# Patient Record
Sex: Female | Born: 1960
Health system: Southern US, Community
[De-identification: ages and names within clinical notes are randomized; demographics above are authoritative.]

## PROBLEM LIST (undated history)

## (undated) ENCOUNTER — Ambulatory Visit: Admission: EM | Discharge status: Against Medical Advice | Payer: 59

## (undated) DIAGNOSIS — F32A Depression, unspecified: Secondary | ICD-10-CM

## (undated) DIAGNOSIS — Z87442 Personal history of urinary calculi: Secondary | ICD-10-CM

## (undated) DIAGNOSIS — F329 Major depressive disorder, single episode, unspecified: Secondary | ICD-10-CM

## (undated) DIAGNOSIS — R251 Tremor, unspecified: Secondary | ICD-10-CM

## (undated) DIAGNOSIS — N189 Chronic kidney disease, unspecified: Secondary | ICD-10-CM

## (undated) DIAGNOSIS — R609 Edema, unspecified: Secondary | ICD-10-CM

## (undated) DIAGNOSIS — M549 Dorsalgia, unspecified: Secondary | ICD-10-CM

## (undated) DIAGNOSIS — N809 Endometriosis, unspecified: Secondary | ICD-10-CM

## (undated) DIAGNOSIS — M1990 Unspecified osteoarthritis, unspecified site: Secondary | ICD-10-CM

## (undated) DIAGNOSIS — G8929 Other chronic pain: Secondary | ICD-10-CM

## (undated) DIAGNOSIS — K219 Gastro-esophageal reflux disease without esophagitis: Secondary | ICD-10-CM

## (undated) HISTORY — PX: NASAL SINUS SURGERY: SHX719

## (undated) HISTORY — DX: Other chronic pain: G89.29

## (undated) HISTORY — PX: KNEE ARTHROSCOPY: SUR90

## (undated) HISTORY — DX: Dorsalgia, unspecified: M54.9

## (undated) HISTORY — DX: Endometriosis, unspecified: N80.9

## (undated) HISTORY — PX: KIDNEY SURGERY: SHX687

## (undated) HISTORY — PX: TONSILLECTOMY: SUR1361

---

## 1991-11-16 HISTORY — PX: ABDOMINAL HYSTERECTOMY: SHX81

## 1991-11-16 HISTORY — PX: SALPINGOOPHORECTOMY: SHX82

## 2003-09-30 ENCOUNTER — Other Ambulatory Visit: Payer: Self-pay

## 2004-12-10 ENCOUNTER — Emergency Department: Payer: Self-pay | Admitting: Emergency Medicine

## 2004-12-12 ENCOUNTER — Emergency Department: Payer: Self-pay | Admitting: Unknown Physician Specialty

## 2005-01-19 ENCOUNTER — Ambulatory Visit: Payer: Self-pay | Admitting: Cardiology

## 2005-06-21 ENCOUNTER — Ambulatory Visit: Payer: Self-pay | Admitting: Family Medicine

## 2005-06-25 ENCOUNTER — Ambulatory Visit: Payer: Self-pay | Admitting: Family Medicine

## 2006-05-05 ENCOUNTER — Ambulatory Visit: Payer: Self-pay | Admitting: Family Medicine

## 2006-05-28 ENCOUNTER — Emergency Department: Payer: Self-pay | Admitting: Emergency Medicine

## 2006-05-30 ENCOUNTER — Ambulatory Visit: Payer: Self-pay | Admitting: Emergency Medicine

## 2006-06-21 ENCOUNTER — Emergency Department: Payer: Self-pay | Admitting: Emergency Medicine

## 2006-08-08 ENCOUNTER — Emergency Department: Payer: Self-pay | Admitting: Emergency Medicine

## 2007-07-29 ENCOUNTER — Emergency Department: Payer: Self-pay | Admitting: Emergency Medicine

## 2007-10-02 ENCOUNTER — Ambulatory Visit: Payer: Self-pay | Admitting: Specialist

## 2007-11-16 HISTORY — PX: SALPINGOOPHORECTOMY: SHX82

## 2008-04-17 ENCOUNTER — Ambulatory Visit: Payer: Self-pay | Admitting: Unknown Physician Specialty

## 2008-06-24 ENCOUNTER — Ambulatory Visit: Payer: Self-pay | Admitting: Unknown Physician Specialty

## 2008-07-04 ENCOUNTER — Ambulatory Visit: Payer: Self-pay | Admitting: Unknown Physician Specialty

## 2009-05-26 ENCOUNTER — Ambulatory Visit: Payer: Self-pay | Admitting: Internal Medicine

## 2009-11-19 ENCOUNTER — Ambulatory Visit: Payer: Self-pay | Admitting: Unknown Physician Specialty

## 2010-02-26 ENCOUNTER — Ambulatory Visit: Payer: Self-pay | Admitting: Otolaryngology

## 2010-11-15 HISTORY — PX: BREAST BIOPSY: SHX20

## 2010-11-26 ENCOUNTER — Ambulatory Visit: Payer: Self-pay | Admitting: Unknown Physician Specialty

## 2010-12-02 ENCOUNTER — Ambulatory Visit: Payer: Self-pay | Admitting: Unknown Physician Specialty

## 2010-12-16 ENCOUNTER — Ambulatory Visit: Payer: Self-pay | Admitting: Unknown Physician Specialty

## 2011-12-01 ENCOUNTER — Ambulatory Visit: Payer: Self-pay | Admitting: Unknown Physician Specialty

## 2012-01-21 ENCOUNTER — Ambulatory Visit: Payer: Self-pay | Admitting: Otolaryngology

## 2012-03-22 ENCOUNTER — Ambulatory Visit: Payer: Self-pay | Admitting: Gastroenterology

## 2012-04-27 ENCOUNTER — Emergency Department: Payer: Self-pay | Admitting: Emergency Medicine

## 2012-04-29 ENCOUNTER — Emergency Department: Payer: Self-pay | Admitting: Internal Medicine

## 2012-04-29 LAB — CBC
HCT: 45 % (ref 35.0–47.0)
HGB: 14.5 g/dL (ref 12.0–16.0)
MCH: 28.3 pg (ref 26.0–34.0)
MCHC: 32.2 g/dL (ref 32.0–36.0)
MCV: 88 fL (ref 80–100)
Platelet: 262 10*3/uL (ref 150–440)
RBC: 5.13 10*6/uL (ref 3.80–5.20)
RDW: 13.5 % (ref 11.5–14.5)
WBC: 13.5 10*3/uL — ABNORMAL HIGH (ref 3.6–11.0)

## 2012-04-29 LAB — COMPREHENSIVE METABOLIC PANEL WITH GFR
Albumin: 3.6 g/dL
Alkaline Phosphatase: 103 U/L
Anion Gap: 7
BUN: 16 mg/dL
Bilirubin,Total: 0.4 mg/dL
Calcium, Total: 9 mg/dL
Chloride: 101 mmol/L
Co2: 29 mmol/L
Creatinine: 0.52 mg/dL — ABNORMAL LOW
EGFR (African American): >60
EGFR (Non-African Amer.): >60
Glucose: 112 mg/dL — ABNORMAL HIGH
Osmolality: 276
Potassium: 3.7 mmol/L
SGOT(AST): 14 U/L — ABNORMAL LOW
SGPT (ALT): 21 U/L
Sodium: 137 mmol/L
Total Protein: 7.9 g/dL

## 2012-04-29 LAB — TROPONIN I: Troponin-I: <0.02 ng/mL

## 2012-04-29 LAB — PROTIME-INR
INR: 0.8
Prothrombin Time: 11.2 secs — ABNORMAL LOW (ref 11.5–14.7)

## 2012-06-19 ENCOUNTER — Ambulatory Visit: Payer: Self-pay | Admitting: Orthopedic Surgery

## 2012-06-30 ENCOUNTER — Emergency Department: Payer: Self-pay | Admitting: Emergency Medicine

## 2012-06-30 LAB — COMPREHENSIVE METABOLIC PANEL
Albumin: 3.6 g/dL (ref 3.4–5.0)
Alkaline Phosphatase: 111 U/L (ref 50–136)
BUN: 14 mg/dL (ref 7–18)
Bilirubin,Total: 0.4 mg/dL (ref 0.2–1.0)
Calcium, Total: 8.8 mg/dL (ref 8.5–10.1)
Chloride: 103 mmol/L (ref 98–107)
Co2: 31 mmol/L (ref 21–32)
Creatinine: 0.64 mg/dL (ref 0.60–1.30)
EGFR (African American): >60
EGFR (Non-African Amer.): >60
Glucose: 93 mg/dL (ref 65–99)
Osmolality: 280 (ref 275–301)
SGOT(AST): 17 U/L (ref 15–37)
SGPT (ALT): 17 U/L (ref 12–78)
Sodium: 140 mmol/L (ref 136–145)

## 2012-06-30 LAB — CBC
MCH: 29 pg (ref 26.0–34.0)
Platelet: 305 10*3/uL (ref 150–440)
RBC: 4.83 10*6/uL (ref 3.80–5.20)
RDW: 13.2 % (ref 11.5–14.5)
WBC: 12.6 10*3/uL — ABNORMAL HIGH (ref 3.6–11.0)

## 2012-07-01 LAB — URINALYSIS, COMPLETE
Bacteria: NONE SEEN
Bilirubin,UR: NEGATIVE
Blood: NEGATIVE
Leukocyte Esterase: NEGATIVE
Ph: 5 (ref 4.5–8.0)
RBC,UR: 1 /HPF (ref 0–5)
Squamous Epithelial: 1
WBC UR: <1 /HPF (ref 0–5)

## 2012-07-01 LAB — DRUG SCREEN, URINE
Barbiturates, Ur Screen: NEGATIVE (ref ?–200)
Cocaine Metabolite,Ur ~~LOC~~: NEGATIVE (ref ?–300)
Opiate, Ur Screen: NEGATIVE (ref ?–300)
Phencyclidine (PCP) Ur S: NEGATIVE (ref ?–25)
Tricyclic, Ur Screen: NEGATIVE (ref ?–1000)

## 2013-08-04 ENCOUNTER — Ambulatory Visit: Payer: Self-pay | Admitting: Internal Medicine

## 2013-11-25 ENCOUNTER — Emergency Department: Payer: Self-pay | Admitting: Emergency Medicine

## 2013-11-25 LAB — URINALYSIS, COMPLETE
Bacteria: NONE SEEN
Bilirubin,UR: NEGATIVE
Glucose,UR: NEGATIVE mg/dL (ref 0–75)
Ketone: NEGATIVE
Leukocyte Esterase: NEGATIVE
NITRITE: NEGATIVE
PH: 5 (ref 4.5–8.0)
Protein: NEGATIVE
RBC,UR: 388 /HPF (ref 0–5)
Specific Gravity: 1.016 (ref 1.003–1.030)
WBC UR: 4 /HPF (ref 0–5)

## 2013-11-25 LAB — COMPREHENSIVE METABOLIC PANEL
Albumin: 3.6 g/dL (ref 3.4–5.0)
Alkaline Phosphatase: 106 U/L
Anion Gap: 3 — ABNORMAL LOW (ref 7–16)
BUN: 16 mg/dL (ref 7–18)
Bilirubin,Total: 0.4 mg/dL (ref 0.2–1.0)
CO2: 28 mmol/L (ref 21–32)
CREATININE: 0.76 mg/dL (ref 0.60–1.30)
Calcium, Total: 8.7 mg/dL (ref 8.5–10.1)
Chloride: 108 mmol/L — ABNORMAL HIGH (ref 98–107)
EGFR (African American): >60
GLUCOSE: 90 mg/dL (ref 65–99)
Osmolality: 278 (ref 275–301)
POTASSIUM: 3.9 mmol/L (ref 3.5–5.1)
SGOT(AST): 13 U/L — ABNORMAL LOW (ref 15–37)
SGPT (ALT): 18 U/L (ref 12–78)
Sodium: 139 mmol/L (ref 136–145)
Total Protein: 7.6 g/dL (ref 6.4–8.2)

## 2013-11-25 LAB — CBC WITH DIFFERENTIAL/PLATELET
HCT: 39.5 % (ref 35.0–47.0)
HGB: 13.5 g/dL (ref 12.0–16.0)
MCV: 87 fL (ref 80–100)
Platelet: 237 10*3/uL (ref 150–440)
RBC: 4.55 10*6/uL (ref 3.80–5.20)
WBC: 5.7 10*3/uL (ref 3.6–11.0)

## 2013-11-25 LAB — LIPASE, BLOOD: Lipase: 76 U/L (ref 73–393)

## 2013-11-27 ENCOUNTER — Observation Stay: Payer: Self-pay | Admitting: Urology

## 2013-11-27 LAB — CBC
HCT: 38.6 % (ref 35.0–47.0)
HGB: 12.9 g/dL (ref 12.0–16.0)
MCH: 28.8 pg (ref 26.0–34.0)
MCHC: 33.4 g/dL (ref 32.0–36.0)
MCV: 86 fL (ref 80–100)
PLATELETS: 221 10*3/uL (ref 150–440)
RBC: 4.46 10*6/uL (ref 3.80–5.20)
RDW: 12.8 % (ref 11.5–14.5)
WBC: 6.5 10*3/uL (ref 3.6–11.0)

## 2013-11-27 LAB — URINALYSIS, COMPLETE
BILIRUBIN, UR: NEGATIVE
Bacteria: NONE SEEN
Glucose,UR: NEGATIVE mg/dL (ref 0–75)
KETONE: NEGATIVE
Leukocyte Esterase: NEGATIVE
Nitrite: NEGATIVE
Ph: 5 (ref 4.5–8.0)
Protein: NEGATIVE
RBC,UR: 20 /HPF (ref 0–5)
Specific Gravity: 1.016 (ref 1.003–1.030)
Squamous Epithelial: 1

## 2013-11-27 LAB — COMPREHENSIVE METABOLIC PANEL
ALBUMIN: 3.7 g/dL (ref 3.4–5.0)
ALK PHOS: 95 U/L
Anion Gap: 3 — ABNORMAL LOW (ref 7–16)
BUN: 17 mg/dL (ref 7–18)
Bilirubin,Total: 0.4 mg/dL (ref 0.2–1.0)
CALCIUM: 9.3 mg/dL (ref 8.5–10.1)
CHLORIDE: 105 mmol/L (ref 98–107)
CREATININE: 0.69 mg/dL (ref 0.60–1.30)
Co2: 28 mmol/L (ref 21–32)
EGFR (African American): >60
EGFR (Non-African Amer.): >60
GLUCOSE: 119 mg/dL — AB (ref 65–99)
Osmolality: 275 (ref 275–301)
Potassium: 3.6 mmol/L (ref 3.5–5.1)
SGOT(AST): 20 U/L (ref 15–37)
SGPT (ALT): 16 U/L (ref 12–78)
Sodium: 136 mmol/L (ref 136–145)
TOTAL PROTEIN: 7.7 g/dL (ref 6.4–8.2)

## 2013-11-27 LAB — HEMATOCRIT: HCT: 34.8 % — AB (ref 35.0–47.0)

## 2013-11-29 LAB — URINE CULTURE

## 2013-12-28 ENCOUNTER — Ambulatory Visit: Payer: Self-pay | Admitting: Unknown Physician Specialty

## 2015-03-08 NOTE — H&P (Signed)
Rivas NAME:  Alison Rivas, Alison Rivas MR#:  [id number] DATE OF BIRTH:  Nov 07, 1961  DATE OF ADMISSION:  11/27/2013  CHIEF COMPLAINT: Right flank pain.   HISTORY OF PRESENT ILLNESS: Alison Rivas is a 54 year old white female with sudden onset of right flank pain on January 10, associated with nausea. Pain became severe, prompting Emergency Room visit on January 11. She had a CT scan at that time revealing a distal 5 mm right ureteral stone with mild hydronephrosis. She was prescribed Flomax and Percocet. Alison pain recurred today and did not respond to Percocet, prompting admission. She had some nausea but no vomiting. She denied fever or chills. She has had no prior similar episodes.   PAST MEDICAL HISTORY:   ALLERGIES: Z-PAK.   CHRONIC HOME MEDICATIONS: Lisinopril and estrogen.   PAST SURGICAL HISTORY: 1. Left knee arthroscopy in 2005.  2.  Left oophorectomy in 2009.  3.  Esophageal dilatation in 2013.   SOCIAL HISTORY: She denied tobacco or drug use.   FAMILY HISTORY: Negative for kidney disease.   PAST AND CURRENT MEDICAL CONDITIONS: Hypertension.   REVIEW OF SYSTEMS: Alison Rivas denied chest pain, shortness of breath, diabetes, stroke or kidney disease.   PHYSICAL EXAMINATION: GENERAL: Obese white female in some distress due to pain.  HEENT: Sclerae were clear. Pupils were equally round, reactive to light and accommodation.  NECK: Supple. No palpable cervical adenopathy. No audible carotid bruits.  LUNGS: Clear to auscultation.   CARDIOVASCULAR: Regular rhythm and rate without audible murmurs.  ABDOMEN: Soft abdomen. She had mild right flank tenderness.  GENITOURINARY AND RECTAL: Deferred.  MUSCULOSKELETAL: Nonfocal.  NEUROLOGICAL: Alert and oriented x 3.   LABORATORY, DIAGNOSTIC AND RADIOLOGIC DATA:    CT scan report was reviewed.    BUN of 17, creatinine of 0.69 today.   IMPRESSION: Right ureterolithiasis with renal colic.   PLAN: 1.  IV fluids and PCA pump for pain.  2.   Strain urine.  3.  Most likely stone pass spontaneously without surgical intervention. I discussed stone disease Alison Rivas.     ____________________________ Suszanne ConnersMichael R. Evelene CroonWolff, MD mrw:cc D: 11/27/2013 20:27:59 ET T: 11/27/2013 20:36:40 ET JOB#: [id number]  cc: Suszanne ConnersMichael R. Evelene CroonWolff, MD, <Dictator> Orson ApeMICHAEL R WOLFF MD ELECTRONICALLY SIGNED 11/28/2013 8:20

## 2015-03-08 NOTE — Op Note (Signed)
PATIENT NAME:  Alison Rivas, Alison Rivas MR#:  [id number] DATE OF BIRTH:  1961/04/01  DATE OF PROCEDURE:  11/27/2013  PREOPERATIVE DIAGNOSIS: Right ureterolithiasis.   POSTOPERATIVE DIAGNOSIS: Right ureterolithiasis.   PROCEDURE: 1.  Right ureteroscopic ureterolithotomy with holmium laser lithotripsy.  2.  Right double pigtail stent placement.  3.  Fluoroscopy.   SURGEON: Anola GurneyMichael Kenlee Vogt, M.D.   ANESTHETISTS: Zena AmosWilliam Kephart, M.D and Anola GurneyMichael Jandel Patriarca, MD  ANESTHETIC METHOD: General per Dr. Henrene HawkingKephart and local per Dr. Evelene CroonWolff.   INDICATION:  See the dictated history and physical. After informed consent, the patient requests the above procedure.   OPERATIVE SUMMARY: After adequate general anesthesia had been obtained, the patient was placed into dorsal lithotomy position and the perineum was prepped and draped in the usual fashion. Fluoroscopy confirmed the presence of a 5 mm distal right ureteral stone. At this point, the 5021 French cystoscope was coupled with the camera and then placed into the bladder. The bladder was thoroughly inspected. Both ureteral orifices were identified and had clear efflux. No bladder mucosal lesions were identified. At this point, a 0.035 Glidewire was advanced up the right ureter and curled into the renal pelvis. A 7 mm balloon ureteral dilating catheter was advanced into the distal ureter and the intramural ureter dilated. The balloon catheter was then removed taking care to leave the guidewire in position. The rigid mini ureteroscope was then coupled with the camera and then advanced into the distal right ureter. The stone was encountered. The 300 micron holmium laser fiber was introduced through the scope and the stone was fully disintegrated. The ureteroscope was then removed. The cystoscope was back loaded over the guidewire. A 6 x 24 cm double pigtail stent was advanced over the guidewire and positioned in the ureter under fluoroscopic guidance. The guidewire was removed, taking  care to leave the stent in position. The suture was left attached to the distal stent. At this point, the bladder was drained and the cystoscope was removed. 10 mL of viscous Xylocaine was instilled within the urethra and the bladder. B and O suppository was placed. The procedure was then terminated and the patient was transferred to the recovery room in stable condition.    ____________________________ Suszanne ConnersMichael R. Evelene CroonWolff, MD mrw:dp D: 11/28/2013 13:27:42 ET T: 11/28/2013 14:07:27 ET JOB#: [id number]  cc: Suszanne ConnersMichael R. Evelene CroonWolff, MD, <Dictator> Orson ApeMICHAEL R Lacoya Wilbanks MD ELECTRONICALLY SIGNED 12/06/2013 8:53

## 2015-07-22 ENCOUNTER — Encounter: Payer: Self-pay | Admitting: *Deleted

## 2015-07-24 ENCOUNTER — Ambulatory Visit: Payer: 59 | Admitting: Anesthesiology

## 2015-07-24 ENCOUNTER — Encounter
Admission: RE | Disposition: A | Discharge status: Home / Self Care | Payer: Self-pay | Source: Ambulatory Visit | Attending: Ophthalmology

## 2015-07-24 ENCOUNTER — Encounter: Payer: Self-pay | Admitting: *Deleted

## 2015-07-24 ENCOUNTER — Ambulatory Visit
Admission: RE | Admit: 2015-07-24 | Discharge: 2015-07-24 | Disposition: A | Discharge status: Home / Self Care | Payer: 59 | Source: Ambulatory Visit | Attending: Ophthalmology | Admitting: Ophthalmology

## 2015-07-24 DIAGNOSIS — F329 Major depressive disorder, single episode, unspecified: Secondary | ICD-10-CM | POA: Insufficient documentation

## 2015-07-24 DIAGNOSIS — K219 Gastro-esophageal reflux disease without esophagitis: Secondary | ICD-10-CM | POA: Diagnosis not present

## 2015-07-24 DIAGNOSIS — H2512 Age-related nuclear cataract, left eye: Secondary | ICD-10-CM | POA: Insufficient documentation

## 2015-07-24 DIAGNOSIS — I10 Essential (primary) hypertension: Secondary | ICD-10-CM | POA: Insufficient documentation

## 2015-07-24 DIAGNOSIS — M1990 Unspecified osteoarthritis, unspecified site: Secondary | ICD-10-CM | POA: Insufficient documentation

## 2015-07-24 DIAGNOSIS — Z881 Allergy status to other antibiotic agents status: Secondary | ICD-10-CM | POA: Diagnosis not present

## 2015-07-24 HISTORY — DX: Major depressive disorder, single episode, unspecified: F32.9

## 2015-07-24 HISTORY — DX: Unspecified osteoarthritis, unspecified site: M19.90

## 2015-07-24 HISTORY — DX: Depression, unspecified: F32.A

## 2015-07-24 HISTORY — DX: Chronic kidney disease, unspecified: N18.9

## 2015-07-24 HISTORY — DX: Gastro-esophageal reflux disease without esophagitis: K21.9

## 2015-07-24 HISTORY — DX: Tremor, unspecified: R25.1

## 2015-07-24 HISTORY — DX: Edema, unspecified: R60.9

## 2015-07-24 HISTORY — PX: CATARACT EXTRACTION W/PHACO: SHX586

## 2015-07-24 SURGERY — PHACOEMULSIFICATION, CATARACT, WITH IOL INSERTION
Anesthesia: Monitor Anesthesia Care | Site: Eye | Laterality: Left | Wound class: Clean

## 2015-07-24 MED ORDER — NA HYALUR & NA CHOND-NA HYALUR 0.4-0.35 ML IO KIT
PACK | INTRAOCULAR | Status: DC | PRN
  Administered 2015-07-24: .75 mL via INTRAOCULAR

## 2015-07-24 MED ORDER — PHENYLEPHRINE HCL 10 % OP SOLN
1.0000 [drp] | OPHTHALMIC | Status: AC | PRN
  Administered 2015-07-24 (×4): 1 [drp] via OPHTHALMIC

## 2015-07-24 MED ORDER — MOXIFLOXACIN HCL 0.5 % OP SOLN
1.0000 [drp] | OPHTHALMIC | Status: AC | PRN
  Administered 2015-07-24 (×3): 1 [drp] via OPHTHALMIC

## 2015-07-24 MED ORDER — MOXIFLOXACIN HCL 0.5 % OP SOLN
OPHTHALMIC | Status: AC
  Administered 2015-07-24: 1 [drp] via OPHTHALMIC
  Filled 2015-07-24: qty 3

## 2015-07-24 MED ORDER — CEFUROXIME OPHTHALMIC INJECTION 1 MG/0.1 ML
INJECTION | OPHTHALMIC | Status: DC | PRN
  Administered 2015-07-24: 0.1 mL via INTRACAMERAL

## 2015-07-24 MED ORDER — TETRACAINE HCL 0.5 % OP SOLN
OPHTHALMIC | Status: AC
  Administered 2015-07-24: 1 [drp] via OPHTHALMIC
  Filled 2015-07-24: qty 2

## 2015-07-24 MED ORDER — PHENYLEPHRINE HCL 10 % OP SOLN
OPHTHALMIC | Status: AC
  Administered 2015-07-24: 1 [drp] via OPHTHALMIC
  Filled 2015-07-24: qty 5

## 2015-07-24 MED ORDER — NA HYALUR & NA CHOND-NA HYALUR 0.55-0.5 ML IO KIT
PACK | INTRAOCULAR | Status: AC
  Filled 2015-07-24: qty 1.05

## 2015-07-24 MED ORDER — BSS IO SOLN
INTRAOCULAR | Status: DC | PRN
  Administered 2015-07-24: 200 mL via INTRAOCULAR

## 2015-07-24 MED ORDER — EPINEPHRINE HCL 1 MG/ML IJ SOLN
INTRAMUSCULAR | Status: AC
  Filled 2015-07-24: qty 1

## 2015-07-24 MED ORDER — LIDOCAINE HCL (PF) 4 % IJ SOLN
INTRAOCULAR | Status: DC | PRN
  Administered 2015-07-24: 4 mL via OPHTHALMIC

## 2015-07-24 MED ORDER — TETRACAINE HCL 0.5 % OP SOLN
1.0000 [drp] | OPHTHALMIC | Status: DC | PRN
  Administered 2015-07-24: 1 [drp] via OPHTHALMIC

## 2015-07-24 MED ORDER — SODIUM CHLORIDE 0.9 % IV SOLN
INTRAVENOUS | Status: DC
  Administered 2015-07-24: 08:00:00 via INTRAVENOUS

## 2015-07-24 MED ORDER — MIDAZOLAM HCL 2 MG/2ML IJ SOLN
INTRAMUSCULAR | Status: DC | PRN
  Administered 2015-07-24: 2 mg via INTRAVENOUS
  Administered 2015-07-24: 1 mg via INTRAVENOUS

## 2015-07-24 MED ORDER — MOXIFLOXACIN HCL 0.5 % OP SOLN
OPHTHALMIC | Status: DC | PRN
  Administered 2015-07-24: 1 [drp] via OPHTHALMIC

## 2015-07-24 MED ORDER — LIDOCAINE HCL (PF) 4 % IJ SOLN
INTRAMUSCULAR | Status: AC
Start: 2015-07-24 — End: 2015-07-24
  Filled 2015-07-24: qty 5

## 2015-07-24 MED ORDER — CYCLOPENTOLATE HCL 2 % OP SOLN
1.0000 [drp] | OPHTHALMIC | Status: AC | PRN
  Administered 2015-07-24 (×4): 1 [drp] via OPHTHALMIC

## 2015-07-24 MED ORDER — CYCLOPENTOLATE HCL 2 % OP SOLN
OPHTHALMIC | Status: AC
  Administered 2015-07-24: 1 [drp] via OPHTHALMIC
  Filled 2015-07-24: qty 2

## 2015-07-24 MED ORDER — CEFUROXIME OPHTHALMIC INJECTION 1 MG/0.1 ML
INJECTION | OPHTHALMIC | Status: AC
  Filled 2015-07-24: qty 0.1

## 2015-07-24 SURGICAL SUPPLY — 22 items
CANNULA ANT/CHMB 27GA (MISCELLANEOUS) ×2 IMPLANT
CUP MEDICINE 2OZ PLAST GRAD ST (MISCELLANEOUS) ×2 IMPLANT
GLOVE BIO SURGEON STRL SZ7 (GLOVE) ×2 IMPLANT
GLOVE SURG LX 6.5 MICRO (GLOVE) ×2
GLOVE SURG LX STRL 6.5 MICRO (GLOVE) ×2 IMPLANT
GOWN STRL REUS W/ TWL LRG LVL3 (GOWN DISPOSABLE) ×2 IMPLANT
GOWN STRL REUS W/TWL LRG LVL3 (GOWN DISPOSABLE) ×2
LENS IOL ACRSF IQ PC 21.5 (Intraocular Lens) ×1 IMPLANT
LENS IOL ACRYSOF IQ POST 21.5 (Intraocular Lens) ×2 IMPLANT
NEEDLE FILTER BLUNT 18X 1/2SAF (NEEDLE) ×1
NEEDLE FILTER BLUNT 18X1 1/2 (NEEDLE) ×1 IMPLANT
PACK CATARACT (MISCELLANEOUS) ×2 IMPLANT
PACK CATARACT BRASINGTON LX (MISCELLANEOUS) ×2 IMPLANT
PACK EYE AFTER SURG (MISCELLANEOUS) ×2 IMPLANT
SOL BSS BAG (MISCELLANEOUS) ×2
SOL PREP PVP 2OZ (MISCELLANEOUS) ×2
SOLUTION BSS BAG (MISCELLANEOUS) ×1 IMPLANT
SOLUTION PREP PVP 2OZ (MISCELLANEOUS) ×1 IMPLANT
SYR 3ML LL SCALE MARK (SYRINGE) ×4 IMPLANT
SYR TB 1ML 27GX1/2 LL (SYRINGE) ×2 IMPLANT
WATER STERILE IRR 1000ML POUR (IV SOLUTION) ×2 IMPLANT
WIPE NON LINTING 3.25X3.25 (MISCELLANEOUS) ×2 IMPLANT

## 2015-07-24 NOTE — Anesthesia Postprocedure Evaluation (Signed)
  Anesthesia Post-op Note  Patient: Alison Rivas  Procedure(s) Performed: Procedure(s) with comments: CATARACT EXTRACTION PHACO AND INTRAOCULAR LENS PLACEMENT (IOC) (Left) - [place]: 00:29.0 AP%: 12.7 CDE: 3.68 Lot # [id number]  Anesthesia type:MAC  Patient location: PACU  Post pain: Pain level controlled  Post assessment: Post-op Vital signs reviewed, Patient's Cardiovascular Status Stable, Respiratory Function Stable, Patent Airway and No signs of Nausea or vomiting  Post vital signs: Reviewed and stable  Last Vitals:  Filed Vitals:   07/24/15 0930  BP: 145/77  Pulse: 66  Temp: 36.1 C  Resp: 16    Level of consciousness: awake, alert  and patient cooperative  Complications: No apparent anesthesia complications

## 2015-07-24 NOTE — Op Note (Signed)
07/24/2015  PRE-OP DIAGNOSIS: Cataract (ICD-10 H25.12) Nuclear sclerotic cataract, LEFT EYE  Post operative diagnosis: Cataract (ICD-10 H25.12) Nuclear sclerotic cataract, LEFT EYE  Procedure: Phacoemulsification with introcular lens [id number](45409)   SURGEON: Surgeon(s) and Role:    * Lia Hopping, MD - Primary  ANESTHESIA: Topical   ESTIMATED BLOOD LOSS: MINIMAL  COMPLICATIONS: None  OPERATIVE DESCRIPTION:  Therapeutic options were discussed with the patient preoperatively, including a discussion of risks and benefits of surgery.  Informed consent was obtained. A dilated fundus exam was performed within 6 months.   The patient was premedicated and brought to the operating room and placed on the operating table in the supine position.  Topical tetracaine was instilled.  After adequate anesthesia, the patient was prepped and draped in the usual fashion.  A wire lid speculum was inserted and the microscope was positioned.  A sideport was used to create a paracentesis site and a mixture of preservative-free lidocaine, BSS, and epinephrine was was instilled into the anterior chamber, followed by viscoelastic.  A clear corneal incision was created using a keratome blade.  Capsulorrhexis was then performed.  In situ phacoemulsification was performed.  Cortical material was removed with the irrigation-aspiration unit.  Viscoelastic was instilled to open the capsular bag.  A posterior chamber intraocular lens, model 21.5 diopters, was inserted and positioned.  Irrigation-aspiration was used to remove all viscoelastic. Intracameral cefuroxime was injected into the eye. Wounds were checked for leakage and confirmed to be secure.  Lid speculum was removed and a shield was placed over the eye.  Patient was returned to the recovery room in stable condition.  IMPLANTS:   Implant Name Type Inv. Item Serial No. Manufacturer Lot No. LRB No. Used  IMPLANT LENS - [id number] Intraocular Lens IMPLANT LENS  [id number] ALCON   Left 1     Postoperative care and discharge medication counseling was discussed with the patient or the parents prior to discharge

## 2015-07-24 NOTE — Anesthesia Preprocedure Evaluation (Signed)
Anesthesia Evaluation  Patient identified by MRN, date of birth, ID band Patient awake    Reviewed: Allergy & Precautions, NPO status , Patient's Chart, lab work & pertinent test results, reviewed documented beta blocker date and time   Airway Mallampati: II  TM Distance: >3 FB     Dental  (+) Chipped   Pulmonary           Cardiovascular hypertension, Pt. on medications      Neuro/Psych PSYCHIATRIC DISORDERS Depression    GI/Hepatic GERD  ,  Endo/Other    Renal/GU Renal InsufficiencyRenal disease     Musculoskeletal  (+) Arthritis ,   Abdominal   Peds  Hematology   Anesthesia Other Findings   Reproductive/Obstetrics                             Anesthesia Physical Anesthesia Plan  ASA: II  Anesthesia Plan: MAC   Post-op Pain Management:    Induction:   Airway Management Planned:   Additional Equipment:   Intra-op Plan:   Post-operative Plan:   Informed Consent: I have reviewed the patients History and Physical, chart, labs and discussed the procedure including the risks, benefits and alternatives for the proposed anesthesia with the patient or authorized representative who has indicated his/her understanding and acceptance.     Plan Discussed with: CRNA  Anesthesia Plan Comments:         Anesthesia Quick Evaluation

## 2015-07-24 NOTE — H&P (Signed)
The history and physical was faxed to the hospital. The history and physical was reviewed by me and no changes have occurred.   

## 2015-07-24 NOTE — Transfer of Care (Signed)
Immediate Anesthesia Transfer of Care Note  Patient: Alison Rivas  Procedure(s) Performed: Procedure(s) with comments: CATARACT EXTRACTION PHACO AND INTRAOCULAR LENS PLACEMENT (IOC) (Left) - [place]: 00:29.0 AP%: 12.7 CDE: 3.68 Lot # [id number]  Patient Location: PACU  Anesthesia Type:MAC  Level of Consciousness: awake, alert  and oriented  Airway & Oxygen Therapy: Patient Spontanous Breathing  Post-op Assessment: Report given to RN and Post -op Vital signs reviewed and stable  Post vital signs: Reviewed and stable  Last Vitals:  Filed Vitals:   07/24/15 0930  BP: 145/77  Pulse: 66  Temp: 36.1 C  Resp: 16    Complications: No apparent anesthesia complications

## 2015-07-24 NOTE — Discharge Instructions (Signed)
Post Operative Instructions  Drop Schedule: Place 1 drop of Durezol in operative eye twice daily Place 1 drop of Ilevro in operative eye once daily Place 1 drop of Vigamox in operative eye four times daily  USE ONLY ONE DROP AT A TIME, WAIT 5 MINUTES BETWEEN DROPS  Eye Protection:  - Do not  rub or squeeze your eyes for one full week. - no strenuous activity or lifting more than 20 pounds during the first week - no hot tubs/saunas/swimming pools for two weeks after surgery - do not get water into your eye (washing your face is permitted but avoid water directly into the eye) - Ensure that you wear the eye shield every night for the first week after surgery   When to call:  It is unusual to have pain following cataract surgery. Pain that is not relieved with Tylenol or Ibuprofen is a reason to call immediately. Vision gradually clears during the first days after surgery; however vision should not get worse or darken. Please call with any unusual symptoms. lights, new floaters, or a curtain over the vision should prompt a phone call.      Eye Surgery Discharge Instructions  Expect mild scratchy sensation or mild soreness. DO NOT RUB YOUR EYE!  The day of surgery:  Minimal physical activity, but bed rest is not required  No reading, computer work, or close hand work  No bending, lifting, or straining.  May watch TV  For 24 hours:  No driving, legal decisions, or alcoholic beverages  Safety precautions  Eat anything you prefer: It is better to start with liquids, then soup then solid foods.  _____ Eye patch should be worn until postoperative exam tomorrow.  ____ Solar shield eyeglasses should be worn for comfort in the sunlight/patch while sleeping  Resume all regular medications including aspirin or Coumadin if these were discontinued prior to surgery. You may shower, bathe, shave, or wash your hair. Tylenol may be taken for mild discomfort.  Call your doctor if  you experience significant pain, nausea, or vomiting, fever > 101 or other signs of infection. [phone] or [phone] Specific instructions:  Follow-up Information    Follow up with Lia Hopping, MD.   Specialty:  Ophthalmology   Why:  Friday Sept. 07/2015 1015   Contact information:   [address] [place] [address] [phone]

## 2016-08-19 ENCOUNTER — Ambulatory Visit
Admission: EM | Admit: 2016-08-19 | Discharge: 2016-08-19 | Disposition: A | Discharge status: Home / Self Care | Payer: 59 | Attending: Family Medicine | Admitting: Family Medicine

## 2016-08-19 DIAGNOSIS — N390 Urinary tract infection, site not specified: Secondary | ICD-10-CM

## 2016-08-19 LAB — URINALYSIS COMPLETE WITH MICROSCOPIC (ARMC ONLY)
Bilirubin Urine: NEGATIVE
GLUCOSE, UA: NEGATIVE mg/dL
HGB URINE DIPSTICK: NEGATIVE
Ketones, ur: NEGATIVE mg/dL
LEUKOCYTES UA: NEGATIVE
Nitrite: NEGATIVE
Protein, ur: 30 mg/dL — AB
Specific Gravity, Urine: 1.02 (ref 1.005–1.030)
pH: 7 (ref 5.0–8.0)

## 2016-08-19 MED ORDER — SULFAMETHOXAZOLE-TRIMETHOPRIM 800-160 MG PO TABS: 1.0000 | ORAL_TABLET | Freq: Two times a day (BID) | ORAL | 0 refills | Status: DC

## 2016-08-19 MED ORDER — PHENAZOPYRIDINE HCL 200 MG PO TABS
200.0000 mg | ORAL_TABLET | Freq: Three times a day (TID) | ORAL | 0 refills | Status: DC
Start: 2016-08-19 — End: 2016-10-24

## 2016-08-19 NOTE — ED Triage Notes (Signed)
Patient reports that she is having urinary urgency and burning that started suddenly this morning. Patient also reports that she is going frequently but little output.

## 2016-08-19 NOTE — ED Provider Notes (Signed)
CSN: [id number]     Arrival date & time 08/19/16  1452 History   None    Chief Complaint  Patient presents with  . Urinary Frequency   (Consider location/radiation/quality/duration/timing/severity/associated sxs/prior Treatment) HPI  This a 55 year old female presents with symptoms of a urinary tract infection. She states that she's been having urinary urgency and burning along with frequency and in satiety. This began this morning. His had a previous urinary tract infections she states this is very similar. Has no vaginal discharge. He denies any fever or chills. She has back pain from previous disc problems but has not had any pain out of the usual for her.       Past Medical History:  Diagnosis Date  . Arthritis   . Cancer (HCC)    cervical  . Depression   . Edema    legs/feet  . GERD (gastroesophageal reflux disease)   . Hypertension   . kidney stones   . Tremors of nervous system    Past Surgical History:  Procedure Laterality Date  . ABDOMINAL HYSTERECTOMY    . CATARACT EXTRACTION W/PHACO Left 07/24/2015   Procedure: CATARACT EXTRACTION PHACO AND INTRAOCULAR LENS PLACEMENT (IOC);  Surgeon: Lia HoppingNisha Mukherjee, MD;  Location: [facility] ORS;  Service: Ophthalmology;  Laterality: Left;  US: 00:29.0 AP%: 12.7 CDE: 3.68 Lot # [id number]  . KIDNEY SURGERY     stent  . KNEE ARTHROSCOPY    . SINUSOTOMY     Family History  Problem Relation Age of Onset  . Osteoporosis Mother   . Coronary artery disease Maternal Grandmother    Social History  Substance Use Topics  . Smoking status: Never Smoker  . Smokeless tobacco: Never Used  . Alcohol use No   OB History    No data available     Review of Systems  Constitutional: Positive for activity change. Negative for appetite change, chills, fatigue and fever.  Genitourinary: Positive for decreased urine volume, dysuria, frequency and urgency. Negative for vaginal bleeding, vaginal discharge and vaginal pain.  All other systems  reviewed and are negative.   Allergies  Zithromax [azithromycin]  Home Medications   Prior to Admission medications   Medication Sig Start Date End Date Taking? Authorizing Provider  HYDROcodone-acetaminophen (NORCO/VICODIN) 5-325 MG per tablet Take 1 tablet by mouth every 6 (six) hours as needed for moderate pain.   Yes Historical Provider, MD  tiZANidine (ZANAFLEX) 4 MG tablet Take 4 mg by mouth every 6 (six) hours as needed for muscle spasms.   Yes Historical Provider, MD  phenazopyridine (PYRIDIUM) 200 MG tablet Take 1 tablet (200 mg total) by mouth 3 (three) times daily. 08/19/16   Lutricia FeilWilliam P Roemer, PA-C  sulfamethoxazole-trimethoprim (BACTRIM DS,SEPTRA DS) 800-160 MG tablet Take 1 tablet by mouth 2 (two) times daily. 08/19/16   Lutricia FeilWilliam P Roemer, PA-C   Meds Ordered and Administered this Visit  Medications - No data to display  BP (!) 159/60 (BP Location: Left Arm)   Pulse 70   Temp 97.1 F (36.2 C) (Tympanic)   Resp 16   Ht 5\' 8"  (1.727 m)   Wt 172 lb (78 kg)   SpO2 100%   BMI 26.15 kg/m  No data found.   Physical Exam  Constitutional: She is oriented to person, place, and time. She appears well-developed and well-nourished. No distress.  HENT:  Head: Normocephalic and atraumatic.  Eyes: EOM are normal. Pupils are equal, round, and reactive to light.  Neck: Normal range of motion. Neck supple.  Abdominal: Soft. Bowel sounds are normal. She exhibits no distension. There is no tenderness. There is no rebound and no guarding.  Musculoskeletal: Normal range of motion.  Neurological: She is alert and oriented to person, place, and time.  Skin: Skin is warm and dry. She is not diaphoretic.  Psychiatric: She has a normal mood and affect. Her behavior is normal. Judgment and thought content normal.  Nursing note and vitals reviewed.   Urgent Care Course   Clinical Course    Procedures (including critical care time)  Labs Review Labs Reviewed  URINALYSIS COMPLETEWITH  MICROSCOPIC ([facility] ONLY) - Abnormal; Notable for the following:       Result Value   Protein, ur 30 (*)    Bacteria, UA MANY (*)    Squamous Epithelial / LPF 0-5 (*)    All other components within normal limits  URINE CULTURE    Imaging Review No results found.   Visual Acuity Review  Right Eye Distance:   Left Eye Distance:   Bilateral Distance:    Right Eye Near:   Left Eye Near:    Bilateral Near:         MDM   1. Lower urinary tract infectious disease    Discharge Medication List as of 08/19/2016  4:00 PM    START taking these medications   Details  phenazopyridine (PYRIDIUM) 200 MG tablet Take 1 tablet (200 mg total) by mouth 3 (three) times daily., Starting Thu 08/19/2016, Normal    sulfamethoxazole-trimethoprim (BACTRIM DS,SEPTRA DS) 800-160 MG tablet Take 1 tablet by mouth 2 (two) times daily., Starting Thu 08/19/2016, Normal      Plan: 1. Test/x-ray results and diagnosis reviewed with patient 2. rx as per orders; risks, benefits, potential side effects reviewed with patient 3. Recommend supportive treatment with Increase fluids. I told the patient that her urine does not appear to have significant findings for UTI but she does deny any vaginal discharge. Is leaving for [place] tomorrow and like to have treatment. I told her I will initiate treatment for UTI, we will culture her urine. she may have a vaginal infection causing her dysuria. She continues to have problems despite the treatment she should follow-up with a doctor in [place] or return to her primary care physician when she returns to this area. If she runs a high fever or has increasing back pain that is not usual for her she should go to the emergency room in [place]. 4. F/u prn if symptoms worsen or don't improve     Lutricia Feil, PA-C 08/19/16 1607

## 2016-08-21 ENCOUNTER — Telehealth: Payer: Self-pay | Admitting: Emergency Medicine

## 2016-08-21 LAB — URINE CULTURE: Special Requests: NORMAL

## 2016-08-21 NOTE — Telephone Encounter (Signed)
Tried calling- no answer.  Left message to call us back regarding her urine culture and if her symptoms have not improved.

## 2016-10-24 ENCOUNTER — Emergency Department
Admission: EM | Admit: 2016-10-24 | Discharge: 2016-10-24 | Disposition: A | Discharge status: Home / Self Care | Payer: 59 | Attending: Emergency Medicine | Admitting: Emergency Medicine

## 2016-10-24 ENCOUNTER — Emergency Department: Payer: 59

## 2016-10-24 ENCOUNTER — Encounter: Payer: Self-pay | Admitting: Emergency Medicine

## 2016-10-24 DIAGNOSIS — Y999 Unspecified external cause status: Secondary | ICD-10-CM | POA: Diagnosis not present

## 2016-10-24 DIAGNOSIS — I10 Essential (primary) hypertension: Secondary | ICD-10-CM | POA: Diagnosis not present

## 2016-10-24 DIAGNOSIS — Z8541 Personal history of malignant neoplasm of cervix uteri: Secondary | ICD-10-CM | POA: Insufficient documentation

## 2016-10-24 DIAGNOSIS — Y9301 Activity, walking, marching and hiking: Secondary | ICD-10-CM | POA: Diagnosis not present

## 2016-10-24 DIAGNOSIS — S300XXA Contusion of lower back and pelvis, initial encounter: Secondary | ICD-10-CM | POA: Diagnosis not present

## 2016-10-24 DIAGNOSIS — Y92009 Unspecified place in unspecified non-institutional (private) residence as the place of occurrence of the external cause: Secondary | ICD-10-CM | POA: Diagnosis not present

## 2016-10-24 DIAGNOSIS — S3992XA Unspecified injury of lower back, initial encounter: Secondary | ICD-10-CM | POA: Diagnosis present

## 2016-10-24 DIAGNOSIS — Z79899 Other long term (current) drug therapy: Secondary | ICD-10-CM | POA: Insufficient documentation

## 2016-10-24 DIAGNOSIS — W109XXA Fall (on) (from) unspecified stairs and steps, initial encounter: Secondary | ICD-10-CM | POA: Diagnosis not present

## 2016-10-24 MED ORDER — ORPHENADRINE CITRATE 30 MG/ML IJ SOLN
60.0000 mg | INTRAMUSCULAR | Status: AC
  Administered 2016-10-24: 60 mg via INTRAMUSCULAR
  Filled 2016-10-24: qty 2

## 2016-10-24 MED ORDER — KETOROLAC TROMETHAMINE 60 MG/2ML IM SOLN
30.0000 mg | Freq: Once | INTRAMUSCULAR | Status: AC
  Administered 2016-10-24: 30 mg via INTRAMUSCULAR
  Filled 2016-10-24: qty 2

## 2016-10-24 NOTE — Discharge Instructions (Signed)
Your exam is normal following your fall at home. You have a deep bruise and hematoma to your buttock. You should apply ice compresses to reduce swelling. Take your home medicine for pain. Follow-up with your provider for continued symptoms.

## 2016-10-24 NOTE — ED Provider Notes (Signed)
[facility] Emergency Department Provider Note ____________________________________________  Time seen: 2008  I have reviewed the triage vital signs and the nursing notes.  HISTORY  Chief Complaint  Fall and Back Pain  HPI Alison Rivas is a 55 y.o. female presents to the ED for evaluation of injury sustained after a slip and fall at home today at 4 pm. Patient describes a mechanical fall on her steps as she walked out of her house today. She slipped, and fell landing on her buttocks. She describes pain to the right buttocks, and notes swelling, tightness, and bruising in the same region.She denies any dysuria, distal paresthesias, or head injury. She is taking her home pain meds, that include hydrocodone and Zanaflex, that she doses for her chronic back pain.  Past Medical History:  Diagnosis Date  . Arthritis   . Cancer (HCC)    cervical  . Depression   . Edema    legs/feet  . GERD (gastroesophageal reflux disease)   . Hypertension   . kidney stones   . Tremors of nervous system     There are no active problems to display for this patient.   Past Surgical History:  Procedure Laterality Date  . ABDOMINAL HYSTERECTOMY    . CATARACT EXTRACTION W/PHACO Left 07/24/2015   Procedure: CATARACT EXTRACTION PHACO AND INTRAOCULAR LENS PLACEMENT (IOC);  Surgeon: Lia HoppingNisha Mukherjee, MD;  Location: [facility] ORS;  Service: Ophthalmology;  Laterality: Left;  US: 00:29.0 AP%: 12.7 CDE: 3.68 Lot # [id number]  . KIDNEY SURGERY     stent  . KNEE ARTHROSCOPY    . SINUSOTOMY      Prior to Admission medications   Medication Sig Start Date End Date Taking? Authorizing Provider  HYDROcodone-acetaminophen (NORCO/VICODIN) 5-325 MG per tablet Take 1 tablet by mouth every 6 (six) hours as needed for moderate pain.   Yes Historical Provider, MD  tiZANidine (ZANAFLEX) 4 MG tablet Take 4 mg by mouth every 6 (six) hours as needed for muscle spasms.   Yes Historical Provider, MD     Allergies Zithromax [azithromycin]  Family History  Problem Relation Age of Onset  . Osteoporosis Mother   . Coronary artery disease Maternal Grandmother     Social History Social History  Substance Use Topics  . Smoking status: Never Smoker  . Smokeless tobacco: Never Used  . Alcohol use No    Review of Systems  Constitutional: Negative for fever. Cardiovascular: Negative for chest pain. Respiratory: Negative for shortness of breath. Gastrointestinal: Negative for abdominal pain, vomiting and diarrhea. Genitourinary: Negative for dysuria. Musculoskeletal: Negative for back pain. Reports right buttocks pain as above. Skin: Negative for rash. Neurological: Negative for headaches, focal weakness or numbness. ____________________________________________  PHYSICAL EXAM:  VITAL SIGNS: ED Triage Vitals [10/24/16 1952]  Enc Vitals Group     BP (!) 164/104     Pulse Rate 71     Resp 17     Temp 98 F (36.7 C)     Temp Source Oral     SpO2 99 %     Weight      Height      Head Circumference      Peak Flow      Pain Score 9     Pain Loc      Pain Edu?      Excl. in GC?     Constitutional: Alert and oriented. Well appearing and in no distress. Head: Normocephalic and atraumatic. Cardiovascular: Normal rate, regular rhythm. Normal  distal pulses. Respiratory: Normal respiratory effort. No wheezes/rales/rhonchi. Gastrointestinal: Soft and nontender. No distention. Musculoskeletal: Normal spinal alignment without midline tenderness, spasm, deformity, or step-off. Patient is tender to the right buttocks at the gluteal cleft. She has a large palpable hematoma to the medial right buttocks. Local erythema and deep ecchymosis is appreciated. Nontender with normal range of motion in all extremities.  Neurologic:  Mildly antalgic gait without ataxia. Normal speech and language. No gross focal neurologic deficits are appreciated. Skin:  Skin is warm, dry and intact. No rash  noted. No scrapes, abrasions, or lacerations are appreciated. ____________________________________________   RADIOLOGY  Sacrum/Coccyx IMPRESSION: No acute osseous abnormality about the sacrum/ coccyx.  I, Issai Werling, Charlesetta IvoryJenise V Bacon, personally viewed and evaluated these images (plain radiographs) as part of my medical decision making, as well as reviewing the written report by the radiologist. ____________________________________________  PROCEDURES  Toradol 30 mg IM Norflex 60 mg IM ____________________________________________  INITIAL IMPRESSION / ASSESSMENT AND PLAN / ED COURSE  Patient is reassured by her negative sacral x-ray. Her symptoms are consistent with a buttocks contusion and traumatic hematoma status post mechanical fall. She is discharged with instructions to dose her home medications for pain as previously prescribed. She will follow with primary care provider as needed. Work note for 2 days as requested is given.  Clinical Course    ____________________________________________  FINAL CLINICAL IMPRESSION(S) / ED DIAGNOSES  Final diagnoses:  Contusion of buttock, initial encounter  Traumatic hematoma of buttock, initial encounter      Lissa HoardJenise V Bacon Idalys Konecny, PA-C 10/24/16 2206    Jeanmarie PlantJames A McShane, MD 10/25/16 [phone]

## 2016-10-24 NOTE — ED Triage Notes (Addendum)
Pt says she slipped on her steps today and fell; c/o pain across her lower back and pelvis; is concerned she fractured her pelvis-pain and swelling to right buttock; pt checked in and went straight to the bathroom, denies difficulty voiding; pt says incident happened about 4pm today

## 2017-03-07 ENCOUNTER — Encounter: Payer: Self-pay | Admitting: *Deleted

## 2017-03-09 MED ORDER — ARMC OPHTHALMIC DILATING DROPS: 1.0000 "application " | OPHTHALMIC | Status: AC

## 2017-03-09 MED ORDER — MOXIFLOXACIN HCL 0.5 % OP SOLN: 1.0000 [drp] | OPHTHALMIC | Status: DC | PRN

## 2017-03-10 ENCOUNTER — Ambulatory Visit: Payer: 59 | Admitting: Anesthesiology

## 2017-03-10 ENCOUNTER — Encounter: Payer: Self-pay | Admitting: *Deleted

## 2017-03-10 ENCOUNTER — Encounter
Admission: RE | Disposition: A | Discharge status: Home / Self Care | Payer: Self-pay | Source: Ambulatory Visit | Attending: Ophthalmology

## 2017-03-10 ENCOUNTER — Ambulatory Visit
Admission: RE | Admit: 2017-03-10 | Discharge: 2017-03-10 | Disposition: A | Discharge status: Home / Self Care | Payer: 59 | Source: Ambulatory Visit | Attending: Ophthalmology | Admitting: Ophthalmology

## 2017-03-10 DIAGNOSIS — H2511 Age-related nuclear cataract, right eye: Secondary | ICD-10-CM | POA: Insufficient documentation

## 2017-03-10 DIAGNOSIS — Z79899 Other long term (current) drug therapy: Secondary | ICD-10-CM | POA: Diagnosis not present

## 2017-03-10 DIAGNOSIS — N289 Disorder of kidney and ureter, unspecified: Secondary | ICD-10-CM | POA: Insufficient documentation

## 2017-03-10 DIAGNOSIS — F329 Major depressive disorder, single episode, unspecified: Secondary | ICD-10-CM | POA: Insufficient documentation

## 2017-03-10 DIAGNOSIS — I10 Essential (primary) hypertension: Secondary | ICD-10-CM | POA: Diagnosis not present

## 2017-03-10 DIAGNOSIS — M1990 Unspecified osteoarthritis, unspecified site: Secondary | ICD-10-CM | POA: Insufficient documentation

## 2017-03-10 DIAGNOSIS — K219 Gastro-esophageal reflux disease without esophagitis: Secondary | ICD-10-CM | POA: Insufficient documentation

## 2017-03-10 HISTORY — PX: CATARACT EXTRACTION W/PHACO: SHX586

## 2017-03-10 HISTORY — DX: Personal history of urinary calculi: Z87.442

## 2017-03-10 SURGERY — PHACOEMULSIFICATION, CATARACT, WITH IOL INSERTION
Anesthesia: Monitor Anesthesia Care | Site: Eye | Laterality: Right | Wound class: Clean

## 2017-03-10 MED ORDER — ARMC OPHTHALMIC DILATING DROPS
OPHTHALMIC | Status: AC
  Filled 2017-03-10: qty 0.4

## 2017-03-10 MED ORDER — ARMC OPHTHALMIC DILATING DROPS
1.0000 "application " | OPHTHALMIC | Status: AC
  Administered 2017-03-10 (×3): 1 via OPHTHALMIC

## 2017-03-10 MED ORDER — POVIDONE-IODINE 5 % OP SOLN
OPHTHALMIC | Status: DC | PRN
  Administered 2017-03-10: 1 via OPHTHALMIC

## 2017-03-10 MED ORDER — EPINEPHRINE PF 1 MG/ML IJ SOLN
INTRAMUSCULAR | Status: AC
Start: 2017-03-10 — End: 2017-03-10
  Filled 2017-03-10: qty 2

## 2017-03-10 MED ORDER — SODIUM HYALURONATE 10 MG/ML IO SOLN
INTRAOCULAR | Status: AC
  Filled 2017-03-10: qty 0.85

## 2017-03-10 MED ORDER — MIDAZOLAM HCL 2 MG/2ML IJ SOLN
INTRAMUSCULAR | Status: DC | PRN
  Administered 2017-03-10 (×2): 0.5 mg via INTRAVENOUS

## 2017-03-10 MED ORDER — POVIDONE-IODINE 5 % OP SOLN
OPHTHALMIC | Status: AC
  Filled 2017-03-10: qty 30

## 2017-03-10 MED ORDER — BSS IO SOLN
INTRAOCULAR | Status: DC | PRN
  Administered 2017-03-10: 08:00:00 via OPHTHALMIC

## 2017-03-10 MED ORDER — ALFENTANIL 500 MCG/ML IJ INJ
INJECTION | INTRAVENOUS | Status: DC | PRN
  Administered 2017-03-10 (×2): 250 ug via INTRAVENOUS

## 2017-03-10 MED ORDER — LIDOCAINE HCL (PF) 4 % IJ SOLN
INTRAMUSCULAR | Status: DC | PRN
  Administered 2017-03-10: 4 mL via OPHTHALMIC

## 2017-03-10 MED ORDER — LIDOCAINE HCL (PF) 2 % IJ SOLN
INTRAMUSCULAR | Status: AC
  Filled 2017-03-10: qty 2

## 2017-03-10 MED ORDER — MOXIFLOXACIN HCL 0.5 % OP SOLN
OPHTHALMIC | Status: DC | PRN
  Administered 2017-03-10: 0.2 mL via OPHTHALMIC

## 2017-03-10 MED ORDER — SODIUM HYALURONATE 10 MG/ML IO SOLN
INTRAOCULAR | Status: DC | PRN
  Administered 2017-03-10: 0.55 mL via INTRAOCULAR

## 2017-03-10 MED ORDER — MIDAZOLAM HCL 2 MG/2ML IJ SOLN
INTRAMUSCULAR | Status: AC
  Filled 2017-03-10: qty 2

## 2017-03-10 MED ORDER — SODIUM HYALURONATE 23 MG/ML IO SOLN
INTRAOCULAR | Status: AC
  Filled 2017-03-10: qty 0.6

## 2017-03-10 MED ORDER — SODIUM HYALURONATE 23 MG/ML IO SOLN
INTRAOCULAR | Status: DC | PRN
  Administered 2017-03-10: 0.6 mL via INTRAOCULAR

## 2017-03-10 MED ORDER — MOXIFLOXACIN HCL 0.5 % OP SOLN
OPHTHALMIC | Status: AC
  Filled 2017-03-10: qty 3

## 2017-03-10 MED ORDER — SODIUM CHLORIDE 0.9 % IV SOLN
INTRAVENOUS | Status: DC
  Administered 2017-03-10 (×2): via INTRAVENOUS

## 2017-03-10 SURGICAL SUPPLY — 20 items
CANNULA ANT/CHMB 27GA (MISCELLANEOUS) ×4 IMPLANT
CUP MEDICINE 2OZ PLAST GRAD ST (MISCELLANEOUS) ×2 IMPLANT
DISSECTOR HYDRO NUCLEUS 50X22 (MISCELLANEOUS) ×2 IMPLANT
GLOVE BIO SURGEON STRL SZ8 (GLOVE) ×2 IMPLANT
GLOVE BIOGEL M 6.5 STRL (GLOVE) ×2 IMPLANT
GLOVE SURG LX 7.5 STRW (GLOVE) ×1
GLOVE SURG LX STRL 7.5 STRW (GLOVE) ×1 IMPLANT
GOWN STRL REUS W/ TWL LRG LVL3 (GOWN DISPOSABLE) ×2 IMPLANT
GOWN STRL REUS W/TWL LRG LVL3 (GOWN DISPOSABLE) ×2
LENS IOL ACRYSOF IQ 21.5 (Intraocular Lens) ×2 IMPLANT
PACK CATARACT (MISCELLANEOUS) ×2 IMPLANT
PACK CATARACT KING (MISCELLANEOUS) ×2 IMPLANT
PACK EYE AFTER SURG (MISCELLANEOUS) ×2 IMPLANT
SOL BSS BAG (MISCELLANEOUS) ×2
SOLUTION BSS BAG (MISCELLANEOUS) ×1 IMPLANT
SYR 3ML LL SCALE MARK (SYRINGE) ×4 IMPLANT
SYR 5ML LL (SYRINGE) ×2 IMPLANT
SYR TB 1ML 27GX1/2 LL (SYRINGE) ×2 IMPLANT
WATER STERILE IRR 250ML POUR (IV SOLUTION) ×2 IMPLANT
WIPE NON LINTING 3.25X3.25 (MISCELLANEOUS) ×2 IMPLANT

## 2017-03-10 NOTE — Anesthesia Preprocedure Evaluation (Signed)
Anesthesia Evaluation  Patient identified by MRN, date of birth, ID band Patient awake    Reviewed: Allergy & Precautions, NPO status , Patient's Chart, lab work & pertinent test results, reviewed documented beta blocker date and time   Airway Mallampati: II  TM Distance: >3 FB     Dental  (+) Chipped   Pulmonary           Cardiovascular hypertension, Pt. on medications      Neuro/Psych PSYCHIATRIC DISORDERS Depression    GI/Hepatic GERD  ,  Endo/Other    Renal/GU Renal disease     Musculoskeletal  (+) Arthritis ,   Abdominal   Peds  Hematology   Anesthesia Other Findings Overbite.  Reproductive/Obstetrics                             Anesthesia Physical Anesthesia Plan  ASA: II  Anesthesia Plan: MAC   Post-op Pain Management:    Induction:   Airway Management Planned:   Additional Equipment:   Intra-op Plan:   Post-operative Plan:   Informed Consent: I have reviewed the patients History and Physical, chart, labs and discussed the procedure including the risks, benefits and alternatives for the proposed anesthesia with the patient or authorized representative who has indicated his/her understanding and acceptance.     Plan Discussed with: CRNA  Anesthesia Plan Comments:         Anesthesia Quick Evaluation

## 2017-03-10 NOTE — Op Note (Signed)
OPERATIVE NOTE  Alison Rivas [id number] 03/10/2017   PREOPERATIVE DIAGNOSIS:  Nuclear sclerotic cataract right eye.  H25.11   POSTOPERATIVE DIAGNOSIS:    Nuclear sclerotic cataract right eye.     PROCEDURE:  Phacoemusification with posterior chamber intraocular lens placement of the right eye   LENS:   Implant Name Type Inv. Item Serial No. Manufacturer Lot No. LRB No. Used  LENS IOL ACRYSOF IQ 21.5 - [id number] Intraocular Lens LENS IOL ACRYSOF IQ 21.5 [id number] ALCON   Right 1       AU00T0 +21.5   ULTRASOUND TIME: 0 minutes 23.7 seconds.  CDE 1.34   SURGEON:  Willey Blade, MD, MPH  ANESTHESIOLOGIST: Anesthesiologist: Berdine Addison, MD CRNA: Charna Busman, CRNA   ANESTHESIA:  Topical with tetracaine drops augmented with 1% preservative-free intracameral lidocaine.  ESTIMATED BLOOD LOSS: less than 1 mL.   COMPLICATIONS:  None.   DESCRIPTION OF PROCEDURE:  The patient was identified in the holding room and transported to the operating room and placed in the supine position under the operating microscope.  The right eye was identified as the operative eye and it was prepped and draped in the usual sterile ophthalmic fashion.   A 1.0 millimeter clear-corneal paracentesis was made at the 10:30 position. 0.5 ml of preservative-free 1% lidocaine with epinephrine was injected into the anterior chamber.  The anterior chamber was filled with Healon 5 viscoelastic.  A 2.4 millimeter keratome was used to make a near-clear corneal incision at the 8:00 position.  A curvilinear capsulorrhexis was made with a cystotome and capsulorrhexis forceps.  Balanced salt solution was used to hydrodissect and hydrodelineate the nucleus.   Phacoemulsification was then used in stop and chop fashion to remove the lens nucleus and epinucleus.  The remaining cortex was then removed using the irrigation and aspiration handpiece. Despite significant polishing, there was a small amount of residual  fibrotic PSC in the peripheral posterior capsule.  Healon was then placed into the capsular bag to distend it for lens placement.  A lens was then injected into the capsular bag.  The remaining viscoelastic was aspirated.   Wounds were hydrated with balanced salt solution.  The anterior chamber was inflated to a physiologic pressure with balanced salt solution.    Intracameral vigamox 0.1 mL undiluted was injected into the eye and a drop placed onto the ocular surface.  No wound leaks were noted.  The patient was taken to the recovery room in stable condition without complications of anesthesia or surgery  Willey Blade 03/10/2017, 8:43 AM

## 2017-03-10 NOTE — Transfer of Care (Signed)
Immediate Anesthesia Transfer of Care Note  Patient: Alison Rivas  Procedure(s) Performed: Procedure(s) with comments: CATARACT EXTRACTION PHACO AND INTRAOCULAR LENS PLACEMENT (IOC) (Right) - [place] 23.7 AP% 5.7 CDE 1.34 Fluid Pack lot # [id number]  Patient Location: PACU and Short Stay  Anesthesia Type:MAC  Level of Consciousness: awake, oriented and patient cooperative  Airway & Oxygen Therapy: Patient Spontanous Breathing  Post-op Assessment: Report given to RN and Post -op Vital signs reviewed and stable  Post vital signs: Reviewed and stable  Last Vitals:  Vitals:   03/10/17 0649  BP: 140/79  Pulse: 61  Resp: 14  Temp: 36.8 C    Last Pain:  Vitals:   03/10/17 0649  TempSrc: Oral  PainSc: 7          Complications: No apparent anesthesia complications

## 2017-03-10 NOTE — H&P (Signed)
The History and Physical notes are on paper, have been signed, and are to be scanned.   I have examined the patient and there are no changes to the H&P.   Willey Blade 03/10/2017 8:10 AM

## 2017-03-10 NOTE — Anesthesia Post-op Follow-up Note (Cosign Needed)
Anesthesia QCDR form completed.        

## 2017-03-10 NOTE — Anesthesia Postprocedure Evaluation (Signed)
Anesthesia Post Note  Patient: Alison Rivas  Procedure(s) Performed: Procedure(s) (LRB): CATARACT EXTRACTION PHACO AND INTRAOCULAR LENS PLACEMENT (IOC) (Right)  Patient location during evaluation: PACU Anesthesia Type: MAC Level of consciousness: awake and alert Pain management: pain level controlled Vital Signs Assessment: post-procedure vital signs reviewed and stable Respiratory status: spontaneous breathing, nonlabored ventilation, respiratory function stable and patient connected to nasal cannula oxygen Cardiovascular status: stable and blood pressure returned to baseline Anesthetic complications: no     Last Vitals:  Vitals:   03/10/17 0846 03/10/17 0847  BP:  137/84  Pulse: 64 66  Resp: 16 16  Temp: 36.7 C 36.7 C    Last Pain:  Vitals:   03/10/17 0846  TempSrc: Temporal  PainSc:                  Shylynn Bruning S

## 2017-03-10 NOTE — Discharge Instructions (Signed)
Eye Surgery Discharge Instructions  Expect mild scratchy sensation or mild soreness. DO NOT RUB YOUR EYE!  The day of surgery:  Minimal physical activity, but bed rest is not required  No reading, computer work, or close hand work  No bending, lifting, or straining.  May watch TV  For 24 hours:  No driving, legal decisions, or alcoholic beverages  Safety precautions  Eat anything you prefer: It is better to start with liquids, then soup then solid foods.  _____ Eye patch should be worn until postoperative exam tomorrow.  ____ Solar shield eyeglasses should be worn for comfort in the sunlight/patch while sleeping  Resume all regular medications including aspirin or Coumadin if these were discontinued prior to surgery. You may shower, bathe, shave, or wash your hair. Tylenol may be taken for mild discomfort.  Call your doctor if you experience significant pain, nausea, or vomiting, fever > 101 or other signs of infection. [phone] or [phone] Specific instructions:  Follow-up Information    Alison Blade, MD Follow up.   Specialty:  Ophthalmology Why:  Alison Rivas at 9:25am Contact information: [address] [place] [address] [phone]

## 2017-04-30 ENCOUNTER — Encounter: Payer: Self-pay | Admitting: Emergency Medicine

## 2017-04-30 ENCOUNTER — Emergency Department
Admission: EM | Admit: 2017-04-30 | Discharge: 2017-04-30 | Disposition: A | Discharge status: Home / Self Care | Payer: 59 | Attending: Emergency Medicine | Admitting: Emergency Medicine

## 2017-04-30 DIAGNOSIS — G8929 Other chronic pain: Secondary | ICD-10-CM | POA: Insufficient documentation

## 2017-04-30 DIAGNOSIS — Z79899 Other long term (current) drug therapy: Secondary | ICD-10-CM | POA: Diagnosis not present

## 2017-04-30 DIAGNOSIS — I10 Essential (primary) hypertension: Secondary | ICD-10-CM | POA: Diagnosis not present

## 2017-04-30 DIAGNOSIS — R21 Rash and other nonspecific skin eruption: Secondary | ICD-10-CM | POA: Diagnosis present

## 2017-04-30 DIAGNOSIS — W57XXXA Bitten or stung by nonvenomous insect and other nonvenomous arthropods, initial encounter: Secondary | ICD-10-CM | POA: Insufficient documentation

## 2017-04-30 DIAGNOSIS — T781XXA Other adverse food reactions, not elsewhere classified, initial encounter: Secondary | ICD-10-CM | POA: Diagnosis not present

## 2017-04-30 DIAGNOSIS — T7840XA Allergy, unspecified, initial encounter: Secondary | ICD-10-CM

## 2017-04-30 LAB — COMPREHENSIVE METABOLIC PANEL
ALT: 13 U/L — ABNORMAL LOW (ref 14–54)
AST: 15 U/L (ref 15–41)
Albumin: 4 g/dL (ref 3.5–5.0)
Alkaline Phosphatase: 85 U/L (ref 38–126)
Anion gap: 6 (ref 5–15)
BUN: 13 mg/dL (ref 6–20)
CO2: 27 mmol/L (ref 22–32)
Calcium: 8.8 mg/dL — ABNORMAL LOW (ref 8.9–10.3)
Chloride: 105 mmol/L (ref 101–111)
Creatinine, Ser: 0.75 mg/dL (ref 0.44–1.00)
GFR calc Af Amer: >60 mL/min (ref >60)
GFR calc non Af Amer: >60 mL/min (ref >60)
Glucose, Bld: 98 mg/dL (ref 65–99)
Potassium: 3.3 mmol/L — ABNORMAL LOW (ref 3.5–5.1)
Sodium: 138 mmol/L (ref 135–145)
Total Bilirubin: 0.5 mg/dL (ref 0.3–1.2)
Total Protein: 7.6 g/dL (ref 6.5–8.1)

## 2017-04-30 LAB — CBC
HCT: 37.8 % (ref 35.0–47.0)
Hemoglobin: 12.9 g/dL (ref 12.0–16.0)
MCH: 29.5 pg (ref 26.0–34.0)
MCHC: 34.3 g/dL (ref 32.0–36.0)
MCV: 86.1 fL (ref 80.0–100.0)
Platelets: 239 10*3/uL (ref 150–440)
RBC: 4.39 MIL/uL (ref 3.80–5.20)
RDW: 13.8 % (ref 11.5–14.5)
WBC: 5.5 10*3/uL (ref 3.6–11.0)

## 2017-04-30 LAB — SEDIMENTATION RATE: SED RATE: 9 mm/h (ref 0–30)

## 2017-04-30 MED ORDER — DIPHENHYDRAMINE HCL 25 MG PO CAPS: 25.0000 mg | ORAL_CAPSULE | ORAL | 0 refills | Status: DC | PRN

## 2017-04-30 MED ORDER — RANITIDINE HCL 150 MG PO TABS: 150.0000 mg | ORAL_TABLET | Freq: Two times a day (BID) | ORAL | 0 refills | Status: DC

## 2017-04-30 MED ORDER — PREDNISONE 10 MG PO TABS: ORAL_TABLET | ORAL | 0 refills | Status: DC

## 2017-04-30 MED ORDER — POTASSIUM CHLORIDE ER 10 MEQ PO TBCR: 10.0000 meq | EXTENDED_RELEASE_TABLET | Freq: Every day | ORAL | 0 refills | Status: DC

## 2017-04-30 MED ORDER — METHYLPREDNISOLONE SODIUM SUCC 125 MG IJ SOLR
125.0000 mg | Freq: Once | INTRAMUSCULAR | Status: AC
  Administered 2017-04-30: 125 mg via INTRAMUSCULAR
  Filled 2017-04-30: qty 2

## 2017-04-30 MED ORDER — CEPHALEXIN 500 MG PO CAPS
500.0000 mg | ORAL_CAPSULE | Freq: Once | ORAL | Status: AC
  Administered 2017-04-30: 500 mg via ORAL
  Filled 2017-04-30: qty 1

## 2017-04-30 MED ORDER — POTASSIUM CHLORIDE CRYS ER 20 MEQ PO TBCR
40.0000 meq | EXTENDED_RELEASE_TABLET | Freq: Once | ORAL | Status: AC
  Administered 2017-04-30: 40 meq via ORAL
  Filled 2017-04-30: qty 2

## 2017-04-30 MED ORDER — CEPHALEXIN 500 MG PO CAPS: 500.0000 mg | ORAL_CAPSULE | Freq: Four times a day (QID) | ORAL | 0 refills | Status: AC

## 2017-04-30 NOTE — ED Notes (Signed)
Patient reports several bug bites to right hand when mowing yesterday. Several raised red bumps noted to right hand and wrist with obvious swelling noted. Reports pain with movement of joints. Denies fever or chills.

## 2017-04-30 NOTE — ED Triage Notes (Signed)
Pt ambulatory to triage in NAD, report multiple bug bites yesterday to right arm and hand, pt primarily concerned about bites on index finger and 5th digit of right hand, obvious swelling, pt reports difficulty and pain to bend, pt reports itching and burning.

## 2017-04-30 NOTE — ED Provider Notes (Signed)
[facility] Emergency Department Provider Note  ____________________________________________  Time seen: Approximately 6:22 PM  I have reviewed the triage vital signs and the nursing notes.   HISTORY  Chief Complaint Insect Bite    HPI Alison Rivas is a 56 y.o. female that presents to the emergency department with rash over right hand and arm for one day. Patient states she was out mowing the lawn yesterday when she got stung by an insect. Rash is slowly spreading. It itches and has a burning sensation. It is becoming painful to move her index and pinky finger. She has been applying hydrocortisone cream and Neosporin to spots. She denies fever, shortness of breath, chest pain, nausea, vomiting, abdominal pain.   Past Medical History:  Diagnosis Date  . Arthritis   . Cancer (HCC)    cervical  . Depression   . Edema    legs/feet  . GERD (gastroesophageal reflux disease)   . History of kidney stones   . Hypertension   . kidney stones   . Pain    CHRONIC BACK  . Tremors of nervous system     There are no active problems to display for this patient.   Past Surgical History:  Procedure Laterality Date  . ABDOMINAL HYSTERECTOMY    . CATARACT EXTRACTION W/PHACO Left 07/24/2015   Procedure: CATARACT EXTRACTION PHACO AND INTRAOCULAR LENS PLACEMENT (IOC);  Surgeon: Lia Hopping, MD;  Location: [facility] ORS;  Service: Ophthalmology;  Laterality: Left;  [place]: 00:29.0 AP%: 12.7 CDE: 3.68 Lot # [id number]  . CATARACT EXTRACTION W/PHACO Right 03/10/2017   Procedure: CATARACT EXTRACTION PHACO AND INTRAOCULAR LENS PLACEMENT (IOC);  Surgeon: Nevada Crane, MD;  Location: [facility] ORS;  Service: Ophthalmology;  Laterality: Right;  [place] 23.7 AP% 5.7 CDE 1.34 Fluid Pack lot # [id number]  . KIDNEY SURGERY     stent  . KNEE ARTHROSCOPY    . SINUSOTOMY      Prior to Admission medications   Medication Sig Start Date End Date Taking? Authorizing Provider  cephALEXin  (KEFLEX) 500 MG capsule Take 1 capsule (500 mg total) by mouth 4 (four) times daily. 04/30/17 05/10/17  Enid Derry, PA-C  diphenhydrAMINE (BENADRYL) 25 mg capsule Take 1 capsule (25 mg total) by mouth every 4 (four) hours as needed. 04/30/17 04/30/18  Enid Derry, PA-C  estradiol (ESTRACE) 1 MG tablet Take 1 mg by mouth daily.    [provider]  HYDROcodone-acetaminophen (NORCO/VICODIN) 5-325 MG per tablet Take 1 tablet by mouth every 6 (six) hours as needed for moderate pain.    [provider]  lisinopril (PRINIVIL,ZESTRIL) 5 MG tablet Take 5 mg by mouth daily.    [provider]  omeprazole (PRILOSEC) 20 MG capsule Take 20 mg by mouth daily.    [provider]  potassium chloride (K-DUR) 10 MEQ tablet Take 1 tablet (10 mEq total) by mouth daily. 04/30/17   Enid Derry, PA-C  predniSONE (DELTASONE) 10 MG tablet Take 6 tablets on day 1, take 5 tablets on day 2, take 4 tablets on day 3, take 3 tablets on day 4, take 2 tablets on day 5, take 1 tablet on day 6 04/30/17   Enid Derry, PA-C  ranitidine (ZANTAC) 150 MG tablet Take 1 tablet (150 mg total) by mouth 2 (two) times daily. 04/30/17 04/30/18  Enid Derry, PA-C    Allergies Zithromax [azithromycin]  Family History  Problem Relation Age of Onset  . Osteoporosis Mother   . Coronary artery disease Maternal  Grandmother     Social History Social History  Substance Use Topics  . Smoking status: Never Smoker  . Smokeless tobacco: Never Used  . Alcohol use No     Review of Systems  Constitutional: No fever/chills Cardiovascular: No chest pain. Respiratory:  No SOB. Gastrointestinal: No abdominal pain.  No nausea, no vomiting.  Musculoskeletal: Positive for index and little finger pain. Skin: Negative for ecchymosis. Neurological: Negative for headaches, numbness or tingling   ____________________________________________   PHYSICAL EXAM:  VITAL SIGNS: ED Triage Vitals [04/30/17  1737]  Enc Vitals Group     BP (!) 155/92     Pulse Rate 76     Resp 16     Temp 97.7 F (36.5 C)     Temp Source Oral     SpO2 99 %     Weight 180 lb (81.6 kg)     Height 5\' 8"  (1.727 m)     Head Circumference      Peak Flow      Pain Score 2     Pain Loc      Pain Edu?      Excl. in GC?      Constitutional: Alert and oriented. Well appearing and in no acute distress. Eyes: Conjunctivae are normal. PERRL. EOMI. Head: Atraumatic. ENT:      Ears:      Nose: No congestion/rhinnorhea.      Mouth/Throat: Mucous membranes are moist.  Neck: No stridor.  Cardiovascular: Normal rate, regular rhythm.  Good peripheral circulation. 2+ radial pulses. Respiratory: Normal respiratory effort without tachypnea or retractions. Lungs CTAB. Good air entry to the bases with no decreased or absent breath sounds. Musculoskeletal: Full range of motion to all extremities. No gross deformities appreciated. Limited range of motion and swelling over the PIP joint of right index and little fingers.  Neurologic:  Normal speech and language. No gross focal neurologic deficits are appreciated.  Skin:  Skin is warm, dry. 1 cm area of erythema to the ulnar side of right wrist and on the right forearm. Psychiatric: Mood and affect are normal. Speech and behavior are normal. Patient exhibits appropriate insight and judgement.   ____________________________________________   LABS (all labs ordered are listed, but only abnormal results are displayed)  Labs Reviewed  COMPREHENSIVE METABOLIC PANEL - Abnormal; Notable for the following:       Result Value   Potassium 3.3 (*)    Calcium 8.8 (*)    ALT 13 (*)    All other components within normal limits  CULTURE, BLOOD (ROUTINE X 2)  CULTURE, BLOOD (ROUTINE X 2)  CBC  SEDIMENTATION RATE  C-REACTIVE PROTEIN   ____________________________________________  EKG   ____________________________________________  RADIOLOGY  No results  found.  ____________________________________________    PROCEDURES  Procedure(s) performed:    Procedures    Medications  methylPREDNISolone sodium succinate (SOLU-MEDROL) 125 mg/2 mL injection 125 mg (125 mg Intramuscular Given 04/30/17 2023)  cephALEXin (KEFLEX) capsule 500 mg (500 mg Oral Given 04/30/17 2023)  potassium chloride SA (K-DUR,KLOR-CON) CR tablet 40 mEq (40 mEq Oral Given 04/30/17 2023)     ____________________________________________   INITIAL IMPRESSION / ASSESSMENT AND PLAN / ED COURSE  Pertinent labs & imaging results that were available during my care of the patient were reviewed by me and considered in my medical decision making (see chart for details).  Review of the [place] CSRS was performed in accordance of the [facility] prior to dispensing any controlled drugs.  Patient's diagnosis is consistent with Insect bite and allergic reaction. Vital signs and exam are reassuring. She does not want an x-ray. Labwork was ordered since patient had swelling and limited range of motion over index finger and little finger PIP joints. Labwork is reassuring. Patient does not have a white blood cell count. Her potassium is low so it will be supplemented and patient was instructed to recheck with primary care this week. She states that she had blood work done last week and it looked good. She is not taking any medication for blood pressure. She was given Solu-Medrol, Pepcid, and Keflex in ED. She is driving so she will take Benadryl when she gets home. Patient will be discharged home with prescriptions for prednisone, Keflex, ranitidine, Benadryl. Patient is to follow up with PCP as directed. Patient is given ED precautions to return to the ED for any worsening or new symptoms.     ____________________________________________  FINAL CLINICAL IMPRESSION(S) / ED DIAGNOSES  Final diagnoses:  Insect bite, initial encounter  Allergic reaction, initial encounter      NEW  MEDICATIONS STARTED DURING THIS VISIT:  Discharge Medication List as of 04/30/2017  8:16 PM    START taking these medications   Details  cephALEXin (KEFLEX) 500 MG capsule Take 1 capsule (500 mg total) by mouth 4 (four) times daily., Starting Sat 04/30/2017, Until Tue 05/10/2017, Print    diphenhydrAMINE (BENADRYL) 25 mg capsule Take 1 capsule (25 mg total) by mouth every 4 (four) hours as needed., Starting Sat 04/30/2017, Until Sun 04/30/2018, Print    potassium chloride (K-DUR) 10 MEQ tablet Take 1 tablet (10 mEq total) by mouth daily., Starting Sat 04/30/2017, Print    predniSONE (DELTASONE) 10 MG tablet Take 6 tablets on day 1, take 5 tablets on day 2, take 4 tablets on day 3, take 3 tablets on day 4, take 2 tablets on day 5, take 1 tablet on day 6, Print    ranitidine (ZANTAC) 150 MG tablet Take 1 tablet (150 mg total) by mouth 2 (two) times daily., Starting Sat 04/30/2017, Until Sun 04/30/2018, Print            This chart was dictated using voice recognition software/Dragon. Despite best efforts to proofread, errors can occur which can change the meaning. Any change was purely unintentional.    Enid Derry, PA-C 04/30/17 2037    Jene Every, MD 04/30/17 2133

## 2017-05-01 LAB — C-REACTIVE PROTEIN

## 2017-05-05 LAB — CULTURE, BLOOD (ROUTINE X 2)
CULTURE: NO GROWTH
Culture: NO GROWTH
SPECIAL REQUESTS: ADEQUATE

## 2017-06-17 ENCOUNTER — Other Ambulatory Visit: Payer: Self-pay | Admitting: *Deleted

## 2017-06-17 DIAGNOSIS — M545 Low back pain, unspecified: Secondary | ICD-10-CM

## 2017-06-17 DIAGNOSIS — M79604 Pain in right leg: Secondary | ICD-10-CM

## 2017-06-17 DIAGNOSIS — M5416 Radiculopathy, lumbar region: Secondary | ICD-10-CM

## 2017-06-17 DIAGNOSIS — M79605 Pain in left leg: Secondary | ICD-10-CM

## 2017-06-27 ENCOUNTER — Ambulatory Visit: Payer: 59

## 2017-07-01 ENCOUNTER — Ambulatory Visit
Admission: RE | Admit: 2017-07-01 | Discharge: 2017-07-01 | Disposition: A | Discharge status: Home / Self Care | Payer: 59 | Source: Ambulatory Visit | Attending: Anesthesiology | Admitting: Anesthesiology

## 2017-07-01 DIAGNOSIS — M545 Low back pain: Secondary | ICD-10-CM | POA: Diagnosis present

## 2017-07-01 DIAGNOSIS — M79604 Pain in right leg: Secondary | ICD-10-CM

## 2017-07-01 DIAGNOSIS — M5137 Other intervertebral disc degeneration, lumbosacral region: Secondary | ICD-10-CM | POA: Insufficient documentation

## 2017-07-01 DIAGNOSIS — M79605 Pain in left leg: Secondary | ICD-10-CM

## 2017-07-01 DIAGNOSIS — M5136 Other intervertebral disc degeneration, lumbar region: Secondary | ICD-10-CM | POA: Diagnosis not present

## 2017-07-01 DIAGNOSIS — M5416 Radiculopathy, lumbar region: Secondary | ICD-10-CM

## 2017-07-04 ENCOUNTER — Other Ambulatory Visit (HOSPITAL_COMMUNITY): Payer: Self-pay | Admitting: Anesthesiology

## 2017-07-04 DIAGNOSIS — M5416 Radiculopathy, lumbar region: Secondary | ICD-10-CM

## 2017-07-06 ENCOUNTER — Other Ambulatory Visit: Payer: Self-pay | Admitting: Anesthesiology

## 2017-07-06 DIAGNOSIS — M5416 Radiculopathy, lumbar region: Secondary | ICD-10-CM

## 2017-08-18 ENCOUNTER — Emergency Department
Admission: EM | Admit: 2017-08-18 | Discharge: 2017-08-18 | Disposition: A | Discharge status: Home / Self Care | Payer: 59 | Attending: Emergency Medicine | Admitting: Emergency Medicine

## 2017-08-18 ENCOUNTER — Emergency Department: Payer: 59

## 2017-08-18 ENCOUNTER — Encounter: Payer: Self-pay | Admitting: Emergency Medicine

## 2017-08-18 DIAGNOSIS — R1031 Right lower quadrant pain: Secondary | ICD-10-CM | POA: Diagnosis present

## 2017-08-18 DIAGNOSIS — F329 Major depressive disorder, single episode, unspecified: Secondary | ICD-10-CM | POA: Insufficient documentation

## 2017-08-18 DIAGNOSIS — Z79899 Other long term (current) drug therapy: Secondary | ICD-10-CM | POA: Insufficient documentation

## 2017-08-18 DIAGNOSIS — Z8541 Personal history of malignant neoplasm of cervix uteri: Secondary | ICD-10-CM | POA: Insufficient documentation

## 2017-08-18 DIAGNOSIS — M25551 Pain in right hip: Secondary | ICD-10-CM | POA: Diagnosis not present

## 2017-08-18 LAB — COMPREHENSIVE METABOLIC PANEL
ALBUMIN: 4 g/dL (ref 3.5–5.0)
ALT: 13 U/L — ABNORMAL LOW (ref 14–54)
ANION GAP: 6 (ref 5–15)
AST: 20 U/L (ref 15–41)
Alkaline Phosphatase: 82 U/L (ref 38–126)
BILIRUBIN TOTAL: 0.8 mg/dL (ref 0.3–1.2)
BUN: 13 mg/dL (ref 6–20)
CO2: 27 mmol/L (ref 22–32)
Calcium: 9.2 mg/dL (ref 8.9–10.3)
Chloride: 108 mmol/L (ref 101–111)
Creatinine, Ser: 0.56 mg/dL (ref 0.44–1.00)
GFR calc Af Amer: >60 mL/min (ref >60)
GLUCOSE: 93 mg/dL (ref 65–99)
POTASSIUM: 4.1 mmol/L (ref 3.5–5.1)
Sodium: 141 mmol/L (ref 135–145)
TOTAL PROTEIN: 7.4 g/dL (ref 6.5–8.1)

## 2017-08-18 LAB — URINALYSIS, COMPLETE (UACMP) WITH MICROSCOPIC
Bacteria, UA: NONE SEEN
Bilirubin Urine: NEGATIVE
GLUCOSE, UA: NEGATIVE mg/dL
HGB URINE DIPSTICK: NEGATIVE
KETONES UR: NEGATIVE mg/dL
LEUKOCYTES UA: NEGATIVE
NITRITE: NEGATIVE
PROTEIN: NEGATIVE mg/dL
Specific Gravity, Urine: 1.019 (ref 1.005–1.030)
pH: 5 (ref 5.0–8.0)

## 2017-08-18 LAB — CBC
HCT: 39.6 % (ref 35.0–47.0)
HEMOGLOBIN: 13.6 g/dL (ref 12.0–16.0)
MCH: 29.1 pg (ref 26.0–34.0)
MCHC: 34.3 g/dL (ref 32.0–36.0)
MCV: 84.8 fL (ref 80.0–100.0)
Platelets: 260 10*3/uL (ref 150–440)
RBC: 4.67 MIL/uL (ref 3.80–5.20)
RDW: 13 % (ref 11.5–14.5)
WBC: 5.7 10*3/uL (ref 3.6–11.0)

## 2017-08-18 MED ORDER — MORPHINE SULFATE (PF) 4 MG/ML IV SOLN
4.0000 mg | Freq: Once | INTRAVENOUS | Status: AC
  Administered 2017-08-18: 4 mg via INTRAVENOUS
  Filled 2017-08-18: qty 1

## 2017-08-18 MED ORDER — OXYCODONE-ACETAMINOPHEN 5-325 MG PO TABS
1.0000 | ORAL_TABLET | Freq: Once | ORAL | Status: AC
  Administered 2017-08-18: 1 via ORAL
  Filled 2017-08-18: qty 1

## 2017-08-18 MED ORDER — IOPAMIDOL (ISOVUE-300) INJECTION 61%
100.0000 mL | Freq: Once | INTRAVENOUS | Status: AC | PRN
  Administered 2017-08-18: 100 mL via INTRAVENOUS

## 2017-08-18 MED ORDER — OXYCODONE-ACETAMINOPHEN 5-325 MG PO TABS: 1.0000 | ORAL_TABLET | Freq: Four times a day (QID) | ORAL | 0 refills | Status: DC | PRN

## 2017-08-18 MED ORDER — KETOROLAC TROMETHAMINE 30 MG/ML IJ SOLN
15.0000 mg | Freq: Once | INTRAMUSCULAR | Status: AC
  Administered 2017-08-18: 15 mg via INTRAVENOUS
  Filled 2017-08-18: qty 1

## 2017-08-18 MED ORDER — SODIUM CHLORIDE 0.9 % IV BOLUS (SEPSIS)
1000.0000 mL | Freq: Once | INTRAVENOUS | Status: AC
  Administered 2017-08-18: 1000 mL via INTRAVENOUS

## 2017-08-18 MED ORDER — IBUPROFEN 600 MG PO TABS: 600.0000 mg | ORAL_TABLET | Freq: Three times a day (TID) | ORAL | 0 refills | Status: DC | PRN

## 2017-08-18 MED ORDER — ONDANSETRON HCL 4 MG/2ML IJ SOLN
4.0000 mg | Freq: Once | INTRAMUSCULAR | Status: AC
  Administered 2017-08-18: 4 mg via INTRAVENOUS
  Filled 2017-08-18: qty 2

## 2017-08-18 NOTE — ED Triage Notes (Signed)
Pt presents to ED with c/o right sided groin /  Lower abd pain since Monday. Worse when standing or walking. Pt states nothing makes it go away. No known injury. Denies vomiting.

## 2017-08-18 NOTE — Discharge Instructions (Signed)
Fortunately today your x-ray, your CT scan, and your blood work were very reassuring. Please take your ibuprofen 3 times a day around the clock for the next week and use your Percocet only as needed for severe pain.  Please make an appointment to follow-up with the orthopedic surgeon if your symptoms do not resolve within the next few days.  Return to the emergency department sooner for any new or worsening symptoms such as fevers, chills, if you cannot walk, worsening pain, or for any other concerns.  It was a pleasure to take care of you today, and thank you for coming to our emergency department.  If you have any questions or concerns before leaving please ask the nurse to grab me and I'm more than happy to go through your aftercare instructions again.  If you were prescribed any opioid pain medication today such as Norco, Vicodin, Percocet, morphine, hydrocodone, or oxycodone please make sure you do not drive when you are taking this medication as it can alter your ability to drive safely.  If you have any concerns once you are home that you are not improving or are in fact getting worse before you can make it to your follow-up appointment, please do not hesitate to call 911 and come back for further evaluation.  Merrily Brittle, MD  Results for orders placed or performed during the hospital encounter of 08/18/17  Comprehensive metabolic panel  Result Value Ref Range   Sodium 141 135 - 145 mmol/L   Potassium 4.1 3.5 - 5.1 mmol/L   Chloride 108 101 - 111 mmol/L   CO2 27 22 - 32 mmol/L   Glucose, Bld 93 65 - 99 mg/dL   BUN 13 6 - 20 mg/dL   Creatinine, Ser 1.61 0.44 - 1.00 mg/dL   Calcium 9.2 8.9 - 09.6 mg/dL   Total Protein 7.4 6.5 - 8.1 g/dL   Albumin 4.0 3.5 - 5.0 g/dL   AST 20 15 - 41 U/L   ALT 13 (L) 14 - 54 U/L   Alkaline Phosphatase 82 38 - 126 U/L   Total Bilirubin 0.8 0.3 - 1.2 mg/dL   GFR calc non Af Amer >60 >60 mL/min   GFR calc Af Amer >60 >60 mL/min   Anion gap 6 5 - 15    CBC  Result Value Ref Range   WBC 5.7 3.6 - 11.0 K/uL   RBC 4.67 3.80 - 5.20 MIL/uL   Hemoglobin 13.6 12.0 - 16.0 g/dL   HCT 04.5 40.9 - 81.1 %   MCV 84.8 80.0 - 100.0 fL   MCH 29.1 26.0 - 34.0 pg   MCHC 34.3 32.0 - 36.0 g/dL   RDW 91.4 78.2 - 95.6 %   Platelets 260 150 - 440 K/uL  Urinalysis, Complete w Microscopic  Result Value Ref Range   Color, Urine YELLOW (A) YELLOW   APPearance CLEAR (A) CLEAR   Specific Gravity, Urine 1.019 1.005 - 1.030   pH 5.0 5.0 - 8.0   Glucose, UA NEGATIVE NEGATIVE mg/dL   Hgb urine dipstick NEGATIVE NEGATIVE   Bilirubin Urine NEGATIVE NEGATIVE   Ketones, ur NEGATIVE NEGATIVE mg/dL   Protein, ur NEGATIVE NEGATIVE mg/dL   Nitrite NEGATIVE NEGATIVE   Leukocytes, UA NEGATIVE NEGATIVE   RBC / HPF 0-5 0 - 5 RBC/hpf   WBC, UA 0-5 0 - 5 WBC/hpf   Bacteria, UA NONE SEEN NONE SEEN   Squamous Epithelial / LPF 0-5 (A) NONE SEEN   Mucus PRESENT  Hyaline Casts, UA PRESENT    Ct Abdomen Pelvis W Contrast  Result Date: 08/18/2017 CLINICAL DATA:  Right-sided groin and lower abdominal pain since Monday. EXAM: CT ABDOMEN AND PELVIS WITH CONTRAST TECHNIQUE: Multidetector CT imaging of the abdomen and pelvis was performed using the standard protocol following bolus administration of intravenous contrast. CONTRAST:  ISOVUE-300 IOPAMIDOL (ISOVUE-300) INJECTION 61% COMPARISON:  CT abdomen pelvis dated November 25, 2013. FINDINGS: Lower chest: No acute abnormality. Hepatobiliary: No focal liver abnormality is seen. Focal fat along the falciform ligament. No gallstones, gallbladder wall thickening, or biliary dilatation. Pancreas: Unremarkable. No pancreatic ductal dilatation or surrounding inflammatory changes. Spleen: Normal in size without focal abnormality. Adrenals/Urinary Tract: Adrenal glands are unremarkable. Punctate bilateral nonobstructing calculi. No focal renal lesion or hydronephrosis. Bladder is unremarkable. Stomach/Bowel: Small hiatal hernia. Stomach  is otherwise within normal limits. Appendix is not well-visualized. No evidence of bowel wall thickening, distention, or inflammatory changes. Vascular/Lymphatic: Minimal aortic atherosclerosis. No enlarged abdominal or pelvic lymph nodes. Reproductive: The uterus is surgically absent. There is a 3.9 cm low-density cystic lesion in the left adnexa, previously 2.1 cm. The right adnexa is unremarkable. Other: No free fluid or pneumoperitoneum. Musculoskeletal: No acute or significant osseous findings. IMPRESSION: 1. No acute intra-abdominal process. 2. Unchanged punctate bilateral nephrolithiasis. 3. Enlarging 3.9 cm low-density cystic lesion in the left adnexa. Given its size, further evaluation with pelvic ultrasound is recommended. This recommendation follows ACR consensus guidelines: White Paper of the ACR Incidental Findings Committee II on Adnexal Findings. J Am Coll Radiol [phone]. Electronically Signed   By: Obie Dredge M.D.   On: 08/18/2017 08:34   Dg Hip Unilat With Pelvis 2-3 Views Right  Result Date: 08/18/2017 CLINICAL DATA:  Right-sided groin and lower abdominal pain since Monday. EXAM: DG HIP (WITH OR WITHOUT PELVIS) 2-3V RIGHT COMPARISON:  None. FINDINGS: There is no evidence of hip fracture or dislocation. There is no evidence of arthropathy or other focal bone abnormality. Contrast in the urinary bladder from abdominal CT earlier today. IMPRESSION: Negative. Electronically Signed   By: Marnee Spring M.D.   On: 08/18/2017 09:52

## 2017-08-18 NOTE — ED Provider Notes (Signed)
[facility] Emergency Department Provider Note  ____________________________________________   First MD Initiated Contact with Patient 08/18/17 [phone]     (approximate)  I have reviewed the triage vital signs and the nursing notes.   HISTORY  Chief Complaint Groin Pain and Back Pain   HPI Alison Rivas is a 56 y.o. female who self presents to the emergency department with 4 days of severe right lower quadrant and right hip pain. The pain is difficult to quantify but is described as severe aching throbbing. It is mostly constant. It is worse when ambulation and attempting to range her hip. She denies fevers or chills. She denies chest pain or shortness of breath. She denies trauma. She denies history of abdominal surgeries. She is nauseated but has not vomited. She denies diarrhea. She hasn't wanted to come the hospital but she awoke at 2 this morning and when she tried to get up to go to the bathroom she was unable to ambulate prompted the visit today.   Past Medical History:  Diagnosis Date  . Arthritis   . Cancer (HCC)    cervical  . Depression   . Edema    legs/feet  . GERD (gastroesophageal reflux disease)   . History of kidney stones   . kidney stones   . Pain    CHRONIC BACK  . Tremors of nervous system     There are no active problems to display for this patient.   Past Surgical History:  Procedure Laterality Date  . ABDOMINAL HYSTERECTOMY    . CATARACT EXTRACTION W/PHACO Left 07/24/2015   Procedure: CATARACT EXTRACTION PHACO AND INTRAOCULAR LENS PLACEMENT (IOC);  Surgeon: Lia Hopping, MD;  Location: [facility] ORS;  Service: Ophthalmology;  Laterality: Left;  [place]: 00:29.0 AP%: 12.7 CDE: 3.68 Lot # [id number]  . CATARACT EXTRACTION W/PHACO Right 03/10/2017   Procedure: CATARACT EXTRACTION PHACO AND INTRAOCULAR LENS PLACEMENT (IOC);  Surgeon: Nevada Crane, MD;  Location: [facility] ORS;  Service: Ophthalmology;  Laterality: Right;  [place] 23.7 AP%  5.7 CDE 1.34 Fluid Pack lot # [id number]  . KIDNEY SURGERY     stent  . KNEE ARTHROSCOPY    . SINUSOTOMY      Prior to Admission medications   Medication Sig Start Date End Date Taking? Authorizing Provider  estradiol (ESTRACE) 1 MG tablet Take 1 mg by mouth daily.   Yes [provider]  HYDROcodone-acetaminophen (NORCO/VICODIN) 5-325 MG per tablet Take 1 tablet by mouth every 6 (six) hours as needed for moderate pain.   Yes [provider]  diphenhydrAMINE (BENADRYL) 25 mg capsule Take 1 capsule (25 mg total) by mouth every 4 (four) hours as needed. Patient not taking: Reported on 08/18/2017 04/30/17 04/30/18  Enid Derry, PA-C  ibuprofen (ADVIL,MOTRIN) 600 MG tablet Take 1 tablet (600 mg total) by mouth every 8 (eight) hours as needed. 08/18/17   Merrily Brittle, MD  oxyCODONE-acetaminophen (ROXICET) 5-325 MG tablet Take 1 tablet by mouth every 6 (six) hours as needed for severe pain. 08/18/17   Merrily Brittle, MD  potassium chloride (K-DUR) 10 MEQ tablet Take 1 tablet (10 mEq total) by mouth daily. Patient not taking: Reported on 08/18/2017 04/30/17   Enid Derry, PA-C  predniSONE (DELTASONE) 10 MG tablet Take 6 tablets on day 1, take 5 tablets on day 2, take 4 tablets on day 3, take 3 tablets on day 4, take 2 tablets on day 5, take 1 tablet on day 6 Patient not taking: Reported on 08/18/2017  04/30/17   Enid Derry, PA-C  ranitidine (ZANTAC) 150 MG tablet Take 1 tablet (150 mg total) by mouth 2 (two) times daily. Patient not taking: Reported on 08/18/2017 04/30/17 04/30/18  Enid Derry, PA-C    Allergies Zithromax [azithromycin]  Family History  Problem Relation Age of Onset  . Osteoporosis Mother   . Coronary artery disease Maternal Grandmother     Social History Social History  Substance Use Topics  . Smoking status: Never Smoker  . Smokeless tobacco: Never Used  . Alcohol use No    Review of Systems Constitutional: No fever/chills Eyes: No visual  changes. ENT: No sore throat. Cardiovascular: Denies chest pain. Respiratory: Denies shortness of breath. Gastrointestinal: positive for abdominal pain.  positive for nausea, no vomiting.  No diarrhea.  No constipation. Genitourinary: Negative for dysuria. Musculoskeletal: Negative for back pain. Skin: Negative for rash. Neurological: Negative for headaches, focal weakness or numbness.   ____________________________________________   PHYSICAL EXAM:  VITAL SIGNS: ED Triage Vitals [08/18/17 0447]  Enc Vitals Group     BP (!) 143/93     Pulse Rate 74     Resp 18     Temp 97.7 F (36.5 C)     Temp Source Oral     SpO2 97 %     Weight 182 lb (82.6 kg)     Height  (1.727 m)     Head Circumference      Peak Flow      Pain Score 8     Pain Loc      Pain Edu?      Excl. in GC?     Constitutional: alert and oriented 4 sitting very still on the bed and appears somewhat uncomfortable Eyes: PERRL EOMI. Head: Atraumatic. Nose: No congestion/rhinnorhea. Mouth/Throat: No trismus Neck: No stridor.   Cardiovascular: Normal rate, regular rhythm. Grossly normal heart sounds.  Good peripheral circulation. Respiratory: Normal respiratory effort.  No retractions. Lungs CTAB and moving good air Gastrointestinal: soft nondistended quite tender right lower quadrant with rebound but no frank peritonitis negative Rovsing's Musculoskeletal: able to logroll right hip with minimal discomfort however when flexing hip or internally or externally rotating the patient is in exquisite discomfort Neurologic:  Normal speech and language. No gross focal neurologic deficits are appreciated. Skin:  Skin is warm, dry and intact. No rash noted. Psychiatric: Mood and affect are normal. Speech and behavior are normal.    ____________________________________________   DIFFERENTIAL includes but not limited to  appendicitis, diverticulitis, septic hip ____________________________________________     LABS (all labs ordered are listed, but only abnormal results are displayed)  Labs Reviewed  COMPREHENSIVE METABOLIC PANEL - Abnormal; Notable for the following:       Result Value   ALT 13 (*)    All other components within normal limits  URINALYSIS, COMPLETE (UACMP) WITH MICROSCOPIC - Abnormal; Notable for the following:    Color, Urine YELLOW (*)    APPearance CLEAR (*)    Squamous Epithelial / LPF 0-5 (*)    All other components within normal limits  CBC    blood work reviewed and interpreted by me as normal __________________________________________  EKG   ____________________________________________  RADIOLOGY  CT scan abdomen and pelvis reviewed by me with no acute disease noted X-ray of the right hip reviewed by me with no acute disease ____________________________________________   PROCEDURES  Procedure(s) performed: no  Procedures  Critical Care performed: no  Observation: no ____________________________________________   INITIAL IMPRESSION / ASSESSMENT  AND PLAN / ED COURSE  Pertinent labs & imaging results that were available during my care of the patient were reviewed by me and considered in my medical decision making (see chart for details).  The patient is in exquisite pain with marked tenderness in her right lower quadrant discomfort when ranging the hip. Unclear if the pathology is either in her abdomen or actually in the hip itself. At this point given the right lower quadrant tenderness and concerned about appendicitis and she'll require CT scan with IV contrast. This would go down through her hip as well which show an effusion. Doubt septic hip at this time however I may consider arthrocentesis of the CT scan is negative.     ----------------------------------------- 9:04 AM on 08/18/2017 -----------------------------------------  The patient's pain is markedly improved. CT scan is negative for acute pathology. She still has discomfort with  ranging the hip. Bedside ultrasound shows trace effusion on the right distal.septic. X-rays pending and she very well may have arthritis in the right hip. ____________________________________________   ----------------------------------------- 10:07 AM on 08/18/2017 -----------------------------------------  The patient's pain is improved and she is able to ambulate. The patient understands that there is some diagnostic uncertainty regarding the etiology of her pain but I do not believe she has a septic, she does not have appendicitis, and I feel she is medically stable for outpatient management. I will give her a trial of nonsteroidals as well as Percocet for breakthrough pain and refer her to orthopedic surgery for further evaluation and treatment. Strict return precautions given and the patient verbalizes understanding and agreement with the plan.   FINAL CLINICAL IMPRESSION(S) / ED DIAGNOSES  Final diagnoses:  Pain of right hip joint      NEW MEDICATIONS STARTED DURING THIS VISIT:  New Prescriptions   IBUPROFEN (ADVIL,MOTRIN) 600 MG TABLET    Take 1 tablet (600 mg total) by mouth every 8 (eight) hours as needed.   OXYCODONE-ACETAMINOPHEN (ROXICET) 5-325 MG TABLET    Take 1 tablet by mouth every 6 (six) hours as needed for severe pain.     Note:  This document was prepared using Dragon voice recognition software and may include unintentional dictation errors.     Merrily Brittle, MD 08/18/17 1008

## 2017-10-13 ENCOUNTER — Emergency Department
Admission: EM | Admit: 2017-10-13 | Discharge: 2017-10-13 | Disposition: A | Discharge status: Home / Self Care | Payer: 59 | Attending: Emergency Medicine | Admitting: Emergency Medicine

## 2017-10-13 ENCOUNTER — Emergency Department: Payer: 59

## 2017-10-13 ENCOUNTER — Encounter: Payer: Self-pay | Admitting: Intensive Care

## 2017-10-13 DIAGNOSIS — K529 Noninfective gastroenteritis and colitis, unspecified: Secondary | ICD-10-CM

## 2017-10-13 DIAGNOSIS — R1084 Generalized abdominal pain: Secondary | ICD-10-CM | POA: Insufficient documentation

## 2017-10-13 DIAGNOSIS — Z8541 Personal history of malignant neoplasm of cervix uteri: Secondary | ICD-10-CM | POA: Diagnosis not present

## 2017-10-13 DIAGNOSIS — R197 Diarrhea, unspecified: Secondary | ICD-10-CM

## 2017-10-13 DIAGNOSIS — K5289 Other specified noninfective gastroenteritis and colitis: Secondary | ICD-10-CM | POA: Diagnosis not present

## 2017-10-13 DIAGNOSIS — R110 Nausea: Secondary | ICD-10-CM | POA: Diagnosis present

## 2017-10-13 DIAGNOSIS — Z79899 Other long term (current) drug therapy: Secondary | ICD-10-CM | POA: Insufficient documentation

## 2017-10-13 LAB — CBC
HEMATOCRIT: 43.5 % (ref 35.0–47.0)
HEMOGLOBIN: 14.5 g/dL (ref 12.0–16.0)
MCH: 28.9 pg (ref 26.0–34.0)
MCHC: 33.2 g/dL (ref 32.0–36.0)
MCV: 86.8 fL (ref 80.0–100.0)
PLATELETS: 266 10*3/uL (ref 150–440)
RBC: 5.02 MIL/uL (ref 3.80–5.20)
RDW: 13.1 % (ref 11.5–14.5)
WBC: 9.6 10*3/uL (ref 3.6–11.0)

## 2017-10-13 LAB — URINALYSIS, COMPLETE (UACMP) WITH MICROSCOPIC
BILIRUBIN URINE: NEGATIVE
GLUCOSE, UA: NEGATIVE mg/dL
HGB URINE DIPSTICK: NEGATIVE
KETONES UR: NEGATIVE mg/dL
LEUKOCYTES UA: NEGATIVE
Nitrite: NEGATIVE
PROTEIN: NEGATIVE mg/dL
Specific Gravity, Urine: 1.024 (ref 1.005–1.030)
pH: 5 (ref 5.0–8.0)

## 2017-10-13 LAB — COMPREHENSIVE METABOLIC PANEL
ALT: 15 U/L (ref 14–54)
AST: 17 U/L (ref 15–41)
Albumin: 3.9 g/dL (ref 3.5–5.0)
Alkaline Phosphatase: 119 U/L (ref 38–126)
Anion gap: 10 (ref 5–15)
BUN: 16 mg/dL (ref 6–20)
CHLORIDE: 103 mmol/L (ref 101–111)
CO2: 26 mmol/L (ref 22–32)
CREATININE: 0.83 mg/dL (ref 0.44–1.00)
Calcium: 8.9 mg/dL (ref 8.9–10.3)
Glucose, Bld: 103 mg/dL — ABNORMAL HIGH (ref 65–99)
POTASSIUM: 3.6 mmol/L (ref 3.5–5.1)
SODIUM: 139 mmol/L (ref 135–145)
Total Bilirubin: 0.3 mg/dL (ref 0.3–1.2)
Total Protein: 7.4 g/dL (ref 6.5–8.1)

## 2017-10-13 LAB — LIPASE, BLOOD: LIPASE: 22 U/L (ref 11–51)

## 2017-10-13 MED ORDER — ONDANSETRON HCL 4 MG/2ML IJ SOLN
4.0000 mg | Freq: Once | INTRAMUSCULAR | Status: AC
  Administered 2017-10-13: 4 mg via INTRAVENOUS
  Filled 2017-10-13: qty 2

## 2017-10-13 MED ORDER — IOPAMIDOL (ISOVUE-300) INJECTION 61%
100.0000 mL | Freq: Once | INTRAVENOUS | Status: AC | PRN
  Administered 2017-10-13: 100 mL via INTRAVENOUS
  Filled 2017-10-13: qty 100

## 2017-10-13 MED ORDER — SODIUM CHLORIDE 0.9 % IV BOLUS (SEPSIS)
1000.0000 mL | Freq: Once | INTRAVENOUS | Status: AC
  Administered 2017-10-13: 1000 mL via INTRAVENOUS

## 2017-10-13 MED ORDER — ONDANSETRON 4 MG PO TBDP
4.0000 mg | ORAL_TABLET | Freq: Once | ORAL | Status: AC | PRN
  Administered 2017-10-13: 4 mg via ORAL
  Filled 2017-10-13: qty 1

## 2017-10-13 MED ORDER — ONDANSETRON 4 MG PO TBDP: 4.0000 mg | ORAL_TABLET | Freq: Three times a day (TID) | ORAL | 0 refills | Status: DC | PRN

## 2017-10-13 MED ORDER — KETOROLAC TROMETHAMINE 30 MG/ML IJ SOLN
15.0000 mg | Freq: Once | INTRAMUSCULAR | Status: AC
  Administered 2017-10-13: 15 mg via INTRAVENOUS
  Filled 2017-10-13: qty 1

## 2017-10-13 NOTE — ED Triage Notes (Signed)
Patient c/o diarrhea since Tuesday that subsided last night and now is experiencing nausea and lower abd pain. Denies emesis. Ambulatory in triage with no problems. A&O x4

## 2017-10-13 NOTE — ED Provider Notes (Signed)
[facility] Emergency Department Provider Note  ____________________________________________  Time seen: Approximately 7:25 PM  I have reviewed the triage vital signs and the nursing notes.   HISTORY  Chief Complaint Diarrhea and Nausea   HPI Alison Rivas is a 56 y.o. female with a history of cervical cancer, kidney stones, diverticulosis, gastritis, hypertensionwho presents for evaluation of abdominal pain, nausea, and diarrhea. Patient reports that her symptoms started 2 days ago after she ate at a fast food restaurant a biscuit. She started having diarrhea which subsided last night. Today she started having nausea and lower abdominal pain. She denies vomiting. She has had chills but no fever. No dysuria or hematuria. Her pain is sharp, constant, located in the lower abdominal area/suprapubic region, nonradiating. Patient has had a prior abdominal hysterectomy but denies any other surgeries.  Past Medical History:  Diagnosis Date  . Arthritis   . Cancer (HCC)    cervical  . Depression   . Edema    legs/feet  . GERD (gastroesophageal reflux disease)   . History of kidney stones   . kidney stones   . Pain    CHRONIC BACK  . Tremors of nervous system     There are no active problems to display for this patient.   Past Surgical History:  Procedure Laterality Date  . ABDOMINAL HYSTERECTOMY    . CATARACT EXTRACTION W/PHACO Left 07/24/2015   Procedure: CATARACT EXTRACTION PHACO AND INTRAOCULAR LENS PLACEMENT (IOC);  Surgeon: Lia HoppingNisha Mukherjee, MD;  Location: [facility] ORS;  Service: Ophthalmology;  Laterality: Left;  US: 00:29.0 AP%: 12.7 CDE: 3.68 Lot # [id number]  . CATARACT EXTRACTION W/PHACO Right 03/10/2017   Procedure: CATARACT EXTRACTION PHACO AND INTRAOCULAR LENS PLACEMENT (IOC);  Surgeon: Nevada CraneBradley Mark King, MD;  Location: [facility] ORS;  Service: Ophthalmology;  Laterality: Right;  US 23.7 AP% 5.7 CDE 1.34 Fluid Pack lot # [id number]  . KIDNEY SURGERY      stent  . KNEE ARTHROSCOPY    . SINUSOTOMY      Prior to Admission medications   Medication Sig Start Date End Date Taking? Authorizing Provider  diphenhydrAMINE (BENADRYL) 25 mg capsule Take 1 capsule (25 mg total) by mouth every 4 (four) hours as needed. Patient not taking: Reported on 08/18/2017 04/30/17 04/30/18  En[place] DerryWagner, Ashley, PA-C  estradiol (ESTRACE) 1 MG tablet Take 1 mg by mouth daily.    [provider]  HYDROcodone-acetaminophen (NORCO/VICODIN) 5-325 MG per tablet Take 1 tablet by mouth every 6 (six) hours as needed for moderate pain.    [provider]  ibuprofen (ADVIL,MOTRIN) 600 MG tablet Take 1 tablet (600 mg total) by mouth every 8 (eight) hours as needed. 08/18/17   Merrily Brittleifenbark, Neil, MD  ondansetron (ZOFRAN ODT) 4 MG disintegrating tablet Take 1 tablet (4 mg total) by mouth every 8 (eight) hours as needed for nausea or vomiting. 10/13/17   Nita SickleVeronese, [place], MD  oxyCODONE-acetaminophen (ROXICET) 5-325 MG tablet Take 1 tablet by mouth every 6 (six) hours as needed for severe pain. 08/18/17   Merrily Brittleifenbark, Neil, MD  potassium chloride (K-DUR) 10 MEQ tablet Take 1 tablet (10 mEq total) by mouth daily. Patient not taking: Reported on 08/18/2017 04/30/17   Enid DerryWagner, Ashley, PA-C  predniSONE (DELTASONE) 10 MG tablet Take 6 tablets on day 1, take 5 tablets on day 2, take 4 tablets on day 3, take 3 tablets on day 4, take 2 tablets on day 5, take 1 tablet on day 6 Patient not taking: Reported on  08/18/2017 04/30/17   Enid DerryWagner, Ashley, PA-C  ranitidine (ZANTAC) 150 MG tablet Take 1 tablet (150 mg total) by mouth 2 (two) times daily. Patient not taking: Reported on 08/18/2017 04/30/17 04/30/18  Enid DerryWagner, Ashley, PA-C    Allergies Zithromax [azithromycin]  Family History  Problem Relation Age of Onset  . Osteoporosis Mother   . Coronary artery disease Maternal Grandmother     Social History Social History   Tobacco Use  . Smoking status: Never Smoker  . Smokeless tobacco:  Never Used  Substance Use Topics  . Alcohol use: No  . Drug use: Yes    Comment: pain clinic prescribes percocets    Review of Systems  Constitutional: Negative for fever. Eyes: Negative for visual changes. ENT: Negative for sore throat. Neck: No neck pain  Cardiovascular: Negative for chest pain. Respiratory: Negative for shortness of breath. Gastrointestinal: + lower abdominal pain, nausea, and diarrhea No vomiting Genitourinary: Negative for dysuria. Musculoskeletal: Negative for back pain. Skin: Negative for rash. Neurological: Negative for headaches, weakness or numbness. Psych: No SI or HI  ____________________________________________   PHYSICAL EXAM:  VITAL SIGNS: ED Triage Vitals  Enc Vitals Group     BP 10/13/17 1731 140/89     Pulse Rate 10/13/17 1731 65     Resp 10/13/17 1731 14     Temp 10/13/17 1731 98 F (36.7 C)     Temp Source 10/13/17 1731 Oral     SpO2 10/13/17 1731 99 %     Weight 10/13/17 1732 182 lb (82.6 kg)     Height 10/13/17 1732 5\' 8"  (1.727 m)     Head Circumference --      Peak Flow --      Pain Score 10/13/17 1731 4     Pain Loc --      Pain Edu? --      Excl. in GC? --     Constitutional: Alert and oriented. Well appearing and in no apparent distress. HEENT:      Head: Normocephalic and atraumatic.         Eyes: Conjunctivae are normal. Sclera is non-icteric.       Mouth/Throat: Mucous membranes are moist.       Neck: Supple with no signs of meningismus. Cardiovascular: Regular rate and rhythm. No murmurs, gallops, or rubs. 2+ symmetrical distal pulses are present in all extremities. No JVD. Respiratory: Normal respiratory effort. Lungs are clear to auscultation bilaterally. No wheezes, crackles, or rhonchi.  Gastrointestinal: Soft, ttp over the suprapubic region but also over the RLQ, and non distended with positive bowel sounds. No rebound or guarding. Genitourinary: No CVA tenderness. Musculoskeletal: Nontender with normal  range of motion in all extremities. No edema, cyanosis, or erythema of extremities. Neurologic: Normal speech and language. Face is symmetric. Moving all extremities. No gross focal neurologic deficits are appreciated. Skin: Skin is warm, dry and intact. No rash noted. Psychiatric: Mood and affect are normal. Speech and behavior are normal.  ____________________________________________   LABS (all labs ordered are listed, but only abnormal results are displayed)  Labs Reviewed  COMPREHENSIVE METABOLIC PANEL - Abnormal; Notable for the following components:      Result Value   Glucose, Bld 103 (*)    All other components within normal limits  URINALYSIS, COMPLETE (UACMP) WITH MICROSCOPIC - Abnormal; Notable for the following components:   Color, Urine YELLOW (*)    APPearance HAZY (*)    Bacteria, UA RARE (*)    Squamous Epithelial / LPF  0-5 (*)    All other components within normal limits  URINE CULTURE  LIPASE, BLOOD  CBC   ____________________________________________  EKG  none  ____________________________________________  RADIOLOGY  CT a/p: 1. No acute intra-abdominal or pelvic process identified. 2. Normal appendix. 3. Punctate nonobstructive right renal nephrolithiasis. 4. 3.7 cm left adnexal cyst, similar to previous. Again, given size, further evaluation with pelvic ultrasound is recommended if not already performed. This recommendation follows ACR consensus guidelines: White Paper of the ACR Incidental Findings Committee II on Adnexal Findings. J Am Coll Radiol [phone]. ____________________________________________   PROCEDURES  Procedure(s) performed: None Procedures Critical Care performed:  None ____________________________________________   INITIAL IMPRESSION / ASSESSMENT AND PLAN / ED COURSE  56 y.o. female with a history of cervical cancer, kidney stones, diverticulosis, gastritis, hypertensionwho presents for evaluation of abdominal pain,  nausea, and diarrhea. Patient is well-appearing, in no distress, is normal vital signs, abdomen is soft and tender to palpation on the suprapubic region and right lower quadrant. Patient burst into tears and jumped off the bed when I pushed on her right lower quadrant. Her vitals and lab work are within normal limits. Presentation most likely concerning for viral gastroenteritis versus food poisoning however since patient is so tender on the right lower quadrant she was sent for CT abdomen and pelvis to rule out appendicitis.    _________________________ 8:24 PM on 10/13/2017 -----------------------------------------  CT with no evidence of appendicitis. Patient feels markedly improved after fluids and Zofran. Tolerating by mouth. Incidental finding of a stable 3.7 cm left adnexal cyst. Discussed recommendations of a pelvic ultrasound as an outpatient. Discussed return precautions with patient. Patient's condition be discharged home with follow-up with her primary care doctor.   As part of my medical decision making, I reviewed the following data within the electronic MEDICAL RECORD NUMBER Nursing notes reviewed and incorporated, Labs reviewed , Radiograph reviewed , Notes from prior ED visits and [place] Controlled Substance Database    Pertinent labs & imaging results that were available during my care of the patient were reviewed by me and considered in my medical decision making (see chart for details).    ____________________________________________   FINAL CLINICAL IMPRESSION(S) / ED DIAGNOSES  Final diagnoses:  Nausea  Diarrhea of presumed infectious origin  Generalized abdominal pain  Gastroenteritis      NEW MEDICATIONS STARTED DURING THIS VISIT:  ED Discharge Orders        Ordered    ondansetron (ZOFRAN ODT) 4 MG disintegrating tablet  Every 8 hours PRN     10/13/17 2023       Note:  This document was prepared using Dragon voice recognition software and may include  unintentional dictation errors.    Nita Sickle, MD 10/13/17 2025

## 2017-10-13 NOTE — Discharge Instructions (Signed)
As I explained to you, your CT showed a cyst in your left ovary. Please make sure to follow up with your OBGYN for outpatient US to better evaluate this cyst and to rule out cancer.

## 2017-10-15 LAB — URINE CULTURE

## 2018-02-04 ENCOUNTER — Other Ambulatory Visit: Payer: Self-pay | Admitting: Certified Nurse Midwife

## 2018-02-07 NOTE — Telephone Encounter (Signed)
Please advise for refill. Different strengths in chart. Thank you!

## 2018-02-22 ENCOUNTER — Other Ambulatory Visit: Payer: Self-pay

## 2018-02-22 MED ORDER — ESTRADIOL 0.5 MG PO TABS: 0.5000 mg | ORAL_TABLET | Freq: Every day | ORAL | 0 refills | Status: DC

## 2018-02-22 NOTE — Telephone Encounter (Signed)
Pt calling, has AEX sched for 5/31 and needs refill of estradiol until then.  Pt aware eRx'd.

## 2018-03-20 ENCOUNTER — Ambulatory Visit: Payer: Self-pay | Admitting: Certified Nurse Midwife

## 2018-04-14 ENCOUNTER — Encounter: Payer: Self-pay | Admitting: Certified Nurse Midwife

## 2018-04-14 ENCOUNTER — Ambulatory Visit (INDEPENDENT_AMBULATORY_CARE_PROVIDER_SITE_OTHER): Payer: 59 | Admitting: Certified Nurse Midwife

## 2018-04-14 VITALS — BP 120/80 | HR 68 | Ht 68.0 in | Wt 185.0 lb

## 2018-04-14 DIAGNOSIS — Z87442 Personal history of urinary calculi: Secondary | ICD-10-CM | POA: Insufficient documentation

## 2018-04-14 DIAGNOSIS — Z1211 Encounter for screening for malignant neoplasm of colon: Secondary | ICD-10-CM

## 2018-04-14 DIAGNOSIS — G8929 Other chronic pain: Secondary | ICD-10-CM | POA: Insufficient documentation

## 2018-04-14 DIAGNOSIS — F329 Major depressive disorder, single episode, unspecified: Secondary | ICD-10-CM | POA: Insufficient documentation

## 2018-04-14 DIAGNOSIS — Z1231 Encounter for screening mammogram for malignant neoplasm of breast: Secondary | ICD-10-CM

## 2018-04-14 DIAGNOSIS — Z13820 Encounter for screening for osteoporosis: Secondary | ICD-10-CM

## 2018-04-14 DIAGNOSIS — F32A Depression, unspecified: Secondary | ICD-10-CM | POA: Insufficient documentation

## 2018-04-14 DIAGNOSIS — Z1239 Encounter for other screening for malignant neoplasm of breast: Secondary | ICD-10-CM

## 2018-04-14 DIAGNOSIS — M549 Dorsalgia, unspecified: Secondary | ICD-10-CM

## 2018-04-14 DIAGNOSIS — N809 Endometriosis, unspecified: Secondary | ICD-10-CM | POA: Insufficient documentation

## 2018-04-14 DIAGNOSIS — Z01419 Encounter for gynecological examination (general) (routine) without abnormal findings: Secondary | ICD-10-CM

## 2018-04-14 LAB — HEMOCCULT GUIAC POC 1CARD (OFFICE): Fecal Occult Blood, POC: NEGATIVE

## 2018-04-14 MED ORDER — ESTRADIOL 1 MG PO TABS: 1.0000 mg | ORAL_TABLET | Freq: Every day | ORAL | 3 refills | Status: DC
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
## 2018-06-11 DIAGNOSIS — L089 Local infection of the skin and subcutaneous tissue, unspecified: Secondary | ICD-10-CM | POA: Diagnosis not present

## 2018-06-11 DIAGNOSIS — S80861A Insect bite (nonvenomous), right lower leg, initial encounter: Secondary | ICD-10-CM | POA: Diagnosis not present

## 2018-06-11 DIAGNOSIS — S60861A Insect bite (nonvenomous) of right wrist, initial encounter: Secondary | ICD-10-CM | POA: Diagnosis not present

## 2018-06-11 DIAGNOSIS — S80862A Insect bite (nonvenomous), left lower leg, initial encounter: Secondary | ICD-10-CM | POA: Diagnosis not present

## 2018-06-11 DIAGNOSIS — S60862A Insect bite (nonvenomous) of left wrist, initial encounter: Secondary | ICD-10-CM | POA: Diagnosis not present

## 2018-06-17 ENCOUNTER — Ambulatory Visit
Admission: EM | Admit: 2018-06-17 | Discharge: 2018-06-17 | Disposition: A | Discharge status: Home / Self Care | Payer: 59 | Attending: Family Medicine | Admitting: Family Medicine

## 2018-06-17 ENCOUNTER — Other Ambulatory Visit: Payer: Self-pay

## 2018-06-17 DIAGNOSIS — L255 Unspecified contact dermatitis due to plants, except food: Secondary | ICD-10-CM | POA: Diagnosis not present

## 2018-06-17 MED ORDER — PREDNISONE 10 MG PO TABS: ORAL_TABLET | ORAL | 0 refills | Status: DC

## 2018-06-17 MED ORDER — HYDROXYZINE HCL 25 MG PO TABS: 25.0000 mg | ORAL_TABLET | Freq: Three times a day (TID) | ORAL | 0 refills | Status: DC | PRN

## 2018-06-17 NOTE — ED Triage Notes (Signed)
Patient was out in the woods prior to onset of symptoms.

## 2018-06-17 NOTE — ED Provider Notes (Signed)
[facility]    CSN: [id number] Arrival date & time: 06/17/18  1311  History   Chief Complaint Chief Complaint  Patient presents with  . Rash   HPI  57 year old female presents with rash.  Patient has recently developed rash.  States that it occurred after she was outside in the woods. Blistering rash.  Located on the upper and lower extremities.  She has ruptured several of the blisters.  She states that the rash is pruritic.  She is been using over-the-counter topicals without resolution.  Persistent.  No known exacerbating factors.  No other associated symptoms.  No other complaints.  Past Medical History:  Diagnosis Date  . Arthritis   . Chronic back pain   . Depression   . Edema    legs/feet  . Endometriosis   . GERD (gastroesophageal reflux disease)   . History of kidney stones   . kidney stones   . Tremors of nervous system     Patient Active Problem List   Diagnosis Date Noted  . History of kidney stones   . Endometriosis   . Depression   . Chronic back pain     Past Surgical History:  Procedure Laterality Date  . ABDOMINAL HYSTERECTOMY  1993   for endometriosis/pelvic pain  . BREAST BIOPSY Right 2012   Dr Colette RibasByrd  . CATARACT EXTRACTION W/PHACO Left 07/24/2015   Procedure: CATARACT EXTRACTION PHACO AND INTRAOCULAR LENS PLACEMENT (IOC);  Surgeon: Lia HoppingNisha Mukherjee, MD;  Location: [facility] ORS;  Service: Ophthalmology;  Laterality: Left;  US: 00:29.0 AP%: 12.7 CDE: 3.68 Lot # [id number]  . CATARACT EXTRACTION W/PHACO Right 03/10/2017   Procedure: CATARACT EXTRACTION PHACO AND INTRAOCULAR LENS PLACEMENT (IOC);  Surgeon: Nevada CraneBradley Mark King, MD;  Location: [facility] OR[facility];  Service: Ophthalmology;  Laterality: Right;  US 23.7 AP% 5.7 CDE 1.34 Fluid Pack lot # [id number] H  . KIDNEY SURGERY     stent  . KNEE ARTHROSCOPY Right   . NASAL SINUS SURGERY    . SALPINGOOPHORECTOMY Right 1993  . SALPINGOOPHORECTOMY Left 2009  . TONSILLECTOMY      OB History    Gravida  2     Para  2   Term  2   Preterm      AB      Living  2     SAB      TAB      Ectopic      Multiple      Live Births  2            Home Medications    Prior to Admission medications   Medication Sig Start Date End Date Taking? Authorizing Provider  estradiol (ESTRACE) 1 MG tablet Take 1 tablet (1 mg total) by mouth daily. 04/14/18  Yes Farrel ConnersGutierrez, Colleen, CNM  oxyCODONE-acetaminophen (PERCOCET) 10-325 MG tablet Take by mouth. 12/15/16  Yes [provider]  diphenhydrAMINE (BENADRYL) 25 mg capsule Take 1 capsule (25 mg total) by mouth every 4 (four) hours as needed. 04/30/17 04/30/18  Enid DerryWagner, Ashley, PA-C  hydrOXYzine (ATARAX/VISTARIL) 25 MG tablet Take 1 tablet (25 mg total) by mouth 3 (three) times daily as needed. 06/17/18   Tommie Samsook, Khian Remo G, DO  predniSONE (DELTASONE) 10 MG tablet 50 mg daily x 3 days, then 40 mg daily x 3 days, then 30 mg daily x 3 days, then 20 mg daily x 3 days, then 10 mg daily x 3 days. 06/17/18   Tommie Samsook, Lorraina Spring G, DO    Family History  Family History  Problem Relation Age of Onset  . Osteoporosis Mother   . Hyperlipidemia Mother   . Coronary artery disease Maternal Grandmother   . Stomach cancer Maternal Grandmother 60  . Breast cancer Paternal Aunt 40  . Cervical cancer Other 22    Social History Social History   Tobacco Use  . Smoking status: Never Smoker  . Smokeless tobacco: Never Used  Substance Use Topics  . Alcohol use: No  . Drug use: Yes    Comment: pain clinic prescribes percocets     Allergies   Amoxicillin-pot clavulanate; Azithromycin; and Triamcinolone   Review of Systems Review of Systems  Constitutional: Negative.   Skin: Positive for rash and wound.   Physical Exam Triage Vital Signs ED Triage Vitals [06/17/18 1329]  Enc Vitals Group     BP (!) 151/92     Pulse Rate 89     Resp      Temp 98.3 F (36.8 C)     Temp Source Oral     SpO2 100 %     Weight 182 lb (82.6 kg)     Height 5\' 8"  (1.727 m)      Head Circumference      Peak Flow      Pain Score 0     Pain Loc      Pain Edu?      Excl. in GC?    Updated Vital Signs BP (!) 151/92   Pulse 89   Temp 98.3 F (36.8 C) (Oral)   Ht 5\' 8"  (1.727 m)   Wt 182 lb (82.6 kg)   SpO2 100%   BMI 27.67 kg/m   Visual Acuity Right Eye Distance:   Left Eye Distance:   Bilateral Distance:    Right Eye Near:   Left Eye Near:    Bilateral Near:     Physical Exam  Constitutional: She is oriented to person, place, and time. She appears well-developed. No distress.  HENT:  Head: Normocephalic and atraumatic.  Pulmonary/Chest: Effort normal. No respiratory distress.  Neurological: She is alert and oriented to person, place, and time.  Skin:  Patient with several areas of ruptured blisters.  Mild surrounding erythema.  No evidence of infection.  Psychiatric: She has a normal mood and affect. Her behavior is normal.  Nursing note and vitals reviewed.  UC Treatments / Results  Labs (all labs ordered are listed, but only abnormal results are displayed) Labs Reviewed - No data to display  EKG None  Radiology No results found.  Procedures Procedures (including critical care time)  Medications Ordered in UC Medications - No data to display  Initial Impression / Assessment and Plan / UC Course  I have reviewed the triage vital signs and the nursing notes.  Pertinent labs & imaging results that were available during my care of the patient were reviewed by me and considered in my medical decision making (see chart for details).    57 year old female presents with dermatitis secondary to poison oak/sumac/or oak.  Treating with prednisone and Atarax.  Final Clinical Impressions(s) / UC Diagnoses   Final diagnoses:  Dermatitis due to plants, including poison ivy, sumac, and oak   Discharge Instructions   None    ED Prescriptions    Medication Sig Dispense Auth. Provider   predniSONE (DELTASONE) 10 MG tablet 50 mg daily x 3  days, then 40 mg daily x 3 days, then 30 mg daily x 3 days, then 20 mg daily x  3 days, then 10 mg daily x 3 days. 45 tablet Hartman Minahan G, DO   hydrOXYzine (ATARAX/VISTARIL) 25 MG tablet Take 1 tablet (25 mg total) by mouth 3 (three) times daily as needed. 30 tablet Tommie Sams, DO     Controlled Substance Prescriptions [place] Controlled Substance Registry consulted? Not Applicable   Tommie Sams, DO 06/17/18 1359

## 2018-06-29 DIAGNOSIS — G8929 Other chronic pain: Secondary | ICD-10-CM | POA: Diagnosis not present

## 2018-06-29 DIAGNOSIS — Z79891 Long term (current) use of opiate analgesic: Secondary | ICD-10-CM | POA: Diagnosis not present

## 2018-06-29 DIAGNOSIS — M79605 Pain in left leg: Secondary | ICD-10-CM | POA: Diagnosis not present

## 2018-06-29 DIAGNOSIS — M545 Low back pain: Secondary | ICD-10-CM | POA: Diagnosis not present

## 2018-06-29 DIAGNOSIS — M79604 Pain in right leg: Secondary | ICD-10-CM | POA: Diagnosis not present

## 2018-06-29 DIAGNOSIS — G894 Chronic pain syndrome: Secondary | ICD-10-CM | POA: Diagnosis not present

## 2018-07-27 DIAGNOSIS — M545 Low back pain: Secondary | ICD-10-CM | POA: Diagnosis not present

## 2018-07-27 DIAGNOSIS — M79604 Pain in right leg: Secondary | ICD-10-CM | POA: Diagnosis not present

## 2018-07-27 DIAGNOSIS — Z79891 Long term (current) use of opiate analgesic: Secondary | ICD-10-CM | POA: Diagnosis not present

## 2018-07-27 DIAGNOSIS — G8929 Other chronic pain: Secondary | ICD-10-CM | POA: Diagnosis not present

## 2018-07-27 DIAGNOSIS — G894 Chronic pain syndrome: Secondary | ICD-10-CM | POA: Diagnosis not present

## 2018-07-27 DIAGNOSIS — M79605 Pain in left leg: Secondary | ICD-10-CM | POA: Diagnosis not present

## 2018-08-25 DIAGNOSIS — M545 Low back pain: Secondary | ICD-10-CM | POA: Diagnosis not present

## 2018-08-25 DIAGNOSIS — M79605 Pain in left leg: Secondary | ICD-10-CM | POA: Diagnosis not present

## 2018-08-25 DIAGNOSIS — G8929 Other chronic pain: Secondary | ICD-10-CM | POA: Diagnosis not present

## 2018-08-25 DIAGNOSIS — G894 Chronic pain syndrome: Secondary | ICD-10-CM | POA: Diagnosis not present

## 2018-08-25 DIAGNOSIS — Z79891 Long term (current) use of opiate analgesic: Secondary | ICD-10-CM | POA: Diagnosis not present

## 2018-08-25 DIAGNOSIS — M79604 Pain in right leg: Secondary | ICD-10-CM | POA: Diagnosis not present

## 2018-09-25 DIAGNOSIS — G894 Chronic pain syndrome: Secondary | ICD-10-CM | POA: Diagnosis not present

## 2018-09-25 DIAGNOSIS — M79604 Pain in right leg: Secondary | ICD-10-CM | POA: Diagnosis not present

## 2018-09-25 DIAGNOSIS — M545 Low back pain: Secondary | ICD-10-CM | POA: Diagnosis not present

## 2018-09-25 DIAGNOSIS — Z79891 Long term (current) use of opiate analgesic: Secondary | ICD-10-CM | POA: Diagnosis not present

## 2018-09-25 DIAGNOSIS — G8929 Other chronic pain: Secondary | ICD-10-CM | POA: Diagnosis not present

## 2018-09-25 DIAGNOSIS — M79605 Pain in left leg: Secondary | ICD-10-CM | POA: Diagnosis not present

## 2018-10-25 DIAGNOSIS — G894 Chronic pain syndrome: Secondary | ICD-10-CM | POA: Diagnosis not present

## 2018-10-25 DIAGNOSIS — Z79891 Long term (current) use of opiate analgesic: Secondary | ICD-10-CM | POA: Diagnosis not present

## 2018-10-25 DIAGNOSIS — M545 Low back pain: Secondary | ICD-10-CM | POA: Diagnosis not present

## 2018-10-25 DIAGNOSIS — M79605 Pain in left leg: Secondary | ICD-10-CM | POA: Diagnosis not present

## 2018-10-25 DIAGNOSIS — G8929 Other chronic pain: Secondary | ICD-10-CM | POA: Diagnosis not present

## 2018-10-25 DIAGNOSIS — M79604 Pain in right leg: Secondary | ICD-10-CM | POA: Diagnosis not present

## 2018-11-05 ENCOUNTER — Ambulatory Visit
Admission: EM | Admit: 2018-11-05 | Discharge: 2018-11-05 | Disposition: A | Discharge status: Home / Self Care | Payer: 59 | Attending: Family Medicine | Admitting: Family Medicine

## 2018-11-05 DIAGNOSIS — J0101 Acute recurrent maxillary sinusitis: Secondary | ICD-10-CM | POA: Diagnosis not present

## 2018-11-05 MED ORDER — DOXYCYCLINE HYCLATE 100 MG PO CAPS: 100.0000 mg | ORAL_CAPSULE | Freq: Two times a day (BID) | ORAL | 0 refills | Status: DC

## 2018-11-05 NOTE — ED Provider Notes (Signed)
[facility]    CSN: [id number] Arrival date & time: 11/05/18  [id number]     History   Chief Complaint Chief Complaint  Patient presents with  . Sinusitis   HPI  57 year old female presents with sinusitis.  Patient reports that she has history of sinusitis and sinus surgery.  She states that she has been having dental discomfort.  She states that she just saw her dentist and this does not appear dental in origin.  She is now having right maxillary sinus pain and pressure.  Severe.  She reports nasal discharge which was bloody and yellow/green.  She states that this started suddenly yesterday.  No fever.  No chills.  No known exacerbating or relieving factors.  No other complaints.  PMH, Surgical Hx, Family Hx, Social History reviewed and updated as below.  Past Medical History:  Diagnosis Date  . Arthritis   . Chronic back pain   . Depression   . Edema    legs/feet  . Endometriosis   . GERD (gastroesophageal reflux disease)   . History of kidney stones   . kidney stones   . Tremors of nervous system     Patient Active Problem List   Diagnosis Date Noted  . History of kidney stones   . Endometriosis   . Depression   . Chronic back pain     Past Surgical History:  Procedure Laterality Date  . ABDOMINAL HYSTERECTOMY  1993   for endometriosis/pelvic pain  . BREAST BIOPSY Right 2012   Dr Colette RibasByrd  . CATARACT EXTRACTION W/PHACO Left 07/24/2015   Procedure: CATARACT EXTRACTION PHACO AND INTRAOCULAR LENS PLACEMENT (IOC);  Surgeon: Lia HoppingNisha Mukherjee, MD;  Location: [facility];  Service: Ophthalmology;  Laterality: Left;  US: 00:29.0 AP%: 12.7 CDE: 3.68 Lot # [id number] H  . CATARACT EXTRACTION W/PHACO Right 03/10/2017   Procedure: CATARACT EXTRACTION PHACO AND INTRAOCULAR LENS PLACEMENT (IOC);  Surgeon: Nevada CraneBradley Mark King, MD;  Location: [facility];  Service: Ophthalmology;  Laterality: Right;  US 23.7 AP% 5.7 CDE 1.34 Fluid Pack lot # [id number] H  . KIDNEY SURGERY     stent    . KNEE ARTHROSCOPY Right   . NASAL SINUS SURGERY    . SALPINGOOPHORECTOMY Right 1993  . SALPINGOOPHORECTOMY Left 2009  . TONSILLECTOMY      OB History    Gravida  2   Para  2   Term  2   Preterm      AB      Living  2     SAB      TAB      Ectopic      Multiple      Live Births  2            Home Medications    Prior to Admission medications   Medication Sig Start Date End Date Taking? Authorizing Provider  estradiol (ESTRACE) 1 MG tablet Take 1 tablet (1 mg total) by mouth daily. 04/14/18  Yes Farrel ConnersGutierrez, Colleen, CNM  oxyCODONE-acetaminophen (PERCOCET) 10-325 MG tablet Take by mouth. 12/15/16  Yes [provider]  diphenhydrAMINE (BENADRYL) 25 mg capsule Take 1 capsule (25 mg total) by mouth every 4 (four) hours as needed. 04/30/17 04/30/18  Enid DerryWagner, Ashley, PA-C  doxycycline (VIBRAMYCIN) 100 MG capsule Take 1 capsule (100 mg total) by mouth 2 (two) times daily. 11/05/18   Tommie Samsook, Vaniah Chambers G, DO  hydrOXYzine (ATARAX/VISTARIL) 25 MG tablet Take 1 tablet (25 mg total) by mouth 3 (three) times daily as needed.  06/17/18   Tommie Samsook, Lakeitha Basques G, DO  predniSONE (DELTASONE) 10 MG tablet 50 mg daily x 3 days, then 40 mg daily x 3 days, then 30 mg daily x 3 days, then 20 mg daily x 3 days, then 10 mg daily x 3 days. 06/17/18   Tommie Samsook, Brieanne Mignone G, DO    Family History Family History  Problem Relation Age of Onset  . Osteoporosis Mother   . Hyperlipidemia Mother   . Coronary artery disease Maternal Grandmother   . Stomach cancer Maternal Grandmother 60  . Breast cancer Paternal Aunt 40  . Cervical cancer Other 22    Social History Social History   Tobacco Use  . Smoking status: Never Smoker  . Smokeless tobacco: Never Used  Substance Use Topics  . Alcohol use: No  . Drug use: Yes    Comment: pain clinic prescribes percocets     Allergies   Amoxicillin-pot clavulanate; Azithromycin; and Triamcinolone   Review of Systems Review of Systems  Constitutional: Negative  for fever.  HENT: Positive for sinus pressure and sinus pain.    Physical Exam Triage Vital Signs ED Triage Vitals  Enc Vitals Group     BP 11/05/18 0908 (!) 152/93     Pulse Rate 11/05/18 0908 75     Resp 11/05/18 0908 18     Temp 11/05/18 0908 98.2 F (36.8 C)     Temp Source 11/05/18 0908 Oral     SpO2 11/05/18 0908 100 %     Weight 11/05/18 0910 180 lb (81.6 kg)     Height 11/05/18 0910 5\' 8"  (1.727 m)     Head Circumference --      Peak Flow --      Pain Score 11/05/18 0910 0     Pain Loc --      Pain Edu? --      Excl. in GC? --    Updated Vital Signs BP (!) 152/93 (BP Location: Left Arm)   Pulse 75   Temp 98.2 F (36.8 C) (Oral)   Resp 18   Ht 5\' 8"  (1.727 m)   Wt 81.6 kg   SpO2 100%   BMI 27.37 kg/m   Visual Acuity Right Eye Distance:   Left Eye Distance:   Bilateral Distance:    Right Eye Near:   Left Eye Near:    Bilateral Near:     Physical Exam Vitals signs and nursing note reviewed.  Constitutional:      General: She is not in acute distress.    Appearance: She is not ill-appearing or toxic-appearing.  HENT:     Head: Normocephalic and atraumatic.     Right Ear: Tympanic membrane normal.     Left Ear: Tympanic membrane normal.     Nose:     Comments: Severe R maxillary tenderness to palpation.    Mouth/Throat:     Pharynx: Oropharynx is clear. No posterior oropharyngeal erythema.  Cardiovascular:     Rate and Rhythm: Normal rate and regular rhythm.  Pulmonary:     Effort: Pulmonary effort is normal.     Breath sounds: Normal breath sounds. No wheezing, rhonchi or rales.  Neurological:     Mental Status: She is alert.  Psychiatric:        Mood and Affect: Mood normal.        Behavior: Behavior normal.    UC Treatments / Results  Labs (all labs ordered are listed, but only abnormal results are displayed) Labs Reviewed -  No data to display  EKG None  Radiology No results found.  Procedures Procedures (including critical care  time)  Medications Ordered in UC Medications - No data to display  Initial Impression / Assessment and Plan / UC Course  I have reviewed the triage vital signs and the nursing notes.  Pertinent labs & imaging results that were available during my care of the patient were reviewed by me and considered in my medical decision making (see chart for details).    57 year old female presents with concern for sinusitis. Given hx of recurrent sinusitis and sinus surgery, placing on Doxy.  Final Clinical Impressions(s) / UC Diagnoses   Final diagnoses:  Acute recurrent maxillary sinusitis   Discharge Instructions   None    ED Prescriptions    Medication Sig Dispense Auth. Provider   doxycycline (VIBRAMYCIN) 100 MG capsule Take 1 capsule (100 mg total) by mouth 2 (two) times daily. 14 capsule Tommie Sams, DO     Controlled Substance Prescriptions [place] Controlled Substance Registry consulted? Not Applicable   Tommie Sams, [place] 11/05/18 8295

## 2018-11-05 NOTE — ED Triage Notes (Signed)
Pt states she is here for a sinus infection that she didn't notice until yesterday. Felt like she has a toothache but knows it's a sinus infection. Pressure and pain on the right side of her face. Did not try any otc medication states antibiotics is the only thing that works.

## 2018-11-23 DIAGNOSIS — M545 Low back pain: Secondary | ICD-10-CM | POA: Diagnosis not present

## 2018-11-23 DIAGNOSIS — G8929 Other chronic pain: Secondary | ICD-10-CM | POA: Diagnosis not present

## 2018-11-23 DIAGNOSIS — M79605 Pain in left leg: Secondary | ICD-10-CM | POA: Diagnosis not present

## 2018-11-23 DIAGNOSIS — G894 Chronic pain syndrome: Secondary | ICD-10-CM | POA: Diagnosis not present

## 2018-11-23 DIAGNOSIS — M79604 Pain in right leg: Secondary | ICD-10-CM | POA: Diagnosis not present

## 2018-11-23 DIAGNOSIS — Z79891 Long term (current) use of opiate analgesic: Secondary | ICD-10-CM | POA: Diagnosis not present

## 2018-12-06 ENCOUNTER — Other Ambulatory Visit: Payer: Self-pay

## 2018-12-06 ENCOUNTER — Ambulatory Visit
Admission: EM | Admit: 2018-12-06 | Discharge: 2018-12-06 | Disposition: A | Discharge status: Home / Self Care | Payer: 59 | Attending: Family Medicine | Admitting: Family Medicine

## 2018-12-06 DIAGNOSIS — J069 Acute upper respiratory infection, unspecified: Secondary | ICD-10-CM | POA: Diagnosis not present

## 2018-12-06 DIAGNOSIS — B9789 Other viral agents as the cause of diseases classified elsewhere: Secondary | ICD-10-CM | POA: Diagnosis not present

## 2018-12-06 MED ORDER — IPRATROPIUM BROMIDE 0.06 % NA SOLN: 2.0000 | Freq: Four times a day (QID) | NASAL | 0 refills | Status: DC | PRN

## 2018-12-06 MED ORDER — BENZONATATE 100 MG PO CAPS: 100.0000 mg | ORAL_CAPSULE | Freq: Three times a day (TID) | ORAL | 0 refills | Status: DC | PRN

## 2018-12-06 MED ORDER — PREDNISONE 50 MG PO TABS: ORAL_TABLET | ORAL | 0 refills | Status: DC

## 2018-12-06 NOTE — ED Triage Notes (Signed)
Patient complains of cough, congestion and hoarseness that started last night, worsening today. Patient states that she is concerned she has bronchitis.

## 2018-12-06 NOTE — Discharge Instructions (Signed)
Rest. ° °Fluids. ° °Medications as prescribed. ° °Take care ° °Dr. Shikha Bibb  °

## 2018-12-06 NOTE — ED Provider Notes (Signed)
[facility]    CSN: [id number] Arrival date & time: 12/06/18  1055  History   Chief Complaint Chief Complaint  Patient presents with  . Cough   HPI  58 year old female presents with cough.  Symptoms started last night. She reports runny nose, and productive cough.  No fever.  She has taken DayQuil without improvement.  Patient is concerned that she has bronchitis.  No known exacerbating factors.  No known relieving factors.  Cough is quite severe.  No other associated symptoms.  No other complaints.  PMH, Surgical Hx, Family Hx, Social History reviewed and updated as below.  Past Medical History:  Diagnosis Date  . Arthritis   . Chronic back pain   . Depression   . Edema    legs/feet  . Endometriosis   . GERD (gastroesophageal reflux disease)   . History of kidney stones   . kidney stones   . Tremors of nervous system     Patient Active Problem List   Diagnosis Date Noted  . History of kidney stones   . Endometriosis   . Depression   . Chronic back pain     Past Surgical History:  Procedure Laterality Date  . ABDOMINAL HYSTERECTOMY  1993   for endometriosis/pelvic pain  . BREAST BIOPSY Right 2012   Dr Colette RibasByrd  . CATARACT EXTRACTION W/PHACO Left 07/24/2015   Procedure: CATARACT EXTRACTION PHACO AND INTRAOCULAR LENS PLACEMENT (IOC);  Surgeon: Lia HoppingNisha Mukherjee, MD;  Location: [facility];  Service: Ophthalmology;  Laterality: Left;  US: 00:29.0 AP%: 12.7 CDE: 3.68 Lot # [id number]  . CATARACT EXTRACTION W/PHACO Right 03/10/2017   Procedure: CATARACT EXTRACTION PHACO AND INTRAOCULAR LENS PLACEMENT (IOC);  Surgeon: Nevada CraneBradley Mark King, MD;  Location: [facility];  Service: Ophthalmology;  Laterality: Right;  US 23.7 AP% 5.7 CDE 1.34 Fluid Pack lot # [id number] H  . KIDNEY SURGERY     stent  . KNEE ARTHROSCOPY Right   . NASAL SINUS SURGERY    . SALPINGOOPHORECTOMY Right 1993  . SALPINGOOPHORECTOMY Left 2009  . TONSILLECTOMY      OB History    Gravida  2   Para    2   Term  2   Preterm      AB      Living  2     SAB      TAB      Ectopic      Multiple      Live Births  2            Home Medications    Prior to Admission medications   Medication Sig Start Date End Date Taking? Authorizing Provider  estradiol (ESTRACE) 1 MG tablet Take 1 tablet (1 mg total) by mouth daily. 04/14/18  Yes Farrel ConnersGutierrez, Colleen, CNM  oxyCODONE-acetaminophen (PERCOCET) 10-325 MG tablet Take by mouth. 12/15/16  Yes [provider]  benzonatate (TESSALON) 100 MG capsule Take 1 capsule (100 mg total) by mouth 3 (three) times daily as needed. 12/06/18   Tommie Samsook, [place]aundra Takacs G, DO  ipratropium (ATROVENT) 0.06 % nasal spray Place 2 sprays into both nostrils 4 (four) times daily as needed for rhinitis. 12/06/18   Tommie Samsook, Faryal Marxen G, DO  predniSONE (DELTASONE) 50 MG tablet 1 tablet daily x 5 days 12/06/18   Tommie Samsook, Chinyere Galiano G, DO    Family History Family History  Problem Relation Age of Onset  . Osteoporosis Mother   . Hyperlipidemia Mother   . Coronary artery disease Maternal Grandmother   .  Stomach cancer Maternal Grandmother 60  . Breast cancer Paternal Aunt 40  . Cervical cancer Other 22    Social History Social History   Tobacco Use  . Smoking status: Never Smoker  . Smokeless tobacco: Never Used  Substance Use Topics  . Alcohol use: No  . Drug use: Yes    Comment: pain clinic prescribes percocets     Allergies   Amoxicillin-pot clavulanate; Azithromycin; and Triamcinolone   Review of Systems Review of Systems  Constitutional: Negative for fever.  HENT: Positive for rhinorrhea.   Respiratory: Positive for cough.    Physical Exam Triage Vital Signs ED Triage Vitals  Enc Vitals Group     BP 12/06/18 1113 (!) 163/92     Pulse Rate 12/06/18 1113 83     Resp 12/06/18 1113 18     Temp 12/06/18 1113 98.7 F (37.1 C)     Temp Source 12/06/18 1113 Oral     SpO2 12/06/18 1113 100 %     Weight 12/06/18 1110 180 lb (81.6 kg)     Height 12/06/18  1110 5\' 8"  (1.727 m)     Head Circumference --      Peak Flow --      Pain Score 12/06/18 1110 8     Pain Loc --      Pain Edu? --      Excl. in GC? --    Updated Vital Signs BP (!) 163/92 (BP Location: Left Arm)   Pulse 83   Temp 98.7 F (37.1 C) (Oral)   Resp 18   Ht 5\' 8"  (1.727 m)   Wt 81.6 kg   SpO2 100%   BMI 27.37 kg/m   Visual Acuity Right Eye Distance:   Left Eye Distance:   Bilateral Distance:    Right Eye Near:   Left Eye Near:    Bilateral Near:     Physical Exam Vitals signs and nursing note reviewed.  Constitutional:      General: She is not in acute distress. HENT:     Head: Normocephalic and atraumatic.     Nose: Rhinorrhea present.     Mouth/Throat:     Pharynx: Oropharynx is clear.  Eyes:     General:        Right eye: No discharge.        Left eye: No discharge.     Conjunctiva/sclera: Conjunctivae normal.  Cardiovascular:     Rate and Rhythm: Normal rate and regular rhythm.  Pulmonary:     Effort: Pulmonary effort is normal.     Breath sounds: No wheezing, rhonchi or rales.  Neurological:     Mental Status: She is alert.  Psychiatric:        Mood and Affect: Mood normal.        Behavior: Behavior normal.    UC Treatments / Results  Labs (all labs ordered are listed, but only abnormal results are displayed) Labs Reviewed - No data to display  EKG None  Radiology No results found.  Procedures Procedures (including critical care time)  Medications Ordered in UC Medications - No data to display  Initial Impression / Assessment and Plan / UC Course  I have reviewed the triage vital signs and the nursing notes.  Pertinent labs & imaging results that were available during my care of the patient were reviewed by me and considered in my medical decision making (see chart for details).    58 year old female presents with viral URI with  cough.  Treating with Atrovent spray, prednisone, Tessalon Perles.  Final Clinical  Impressions(s) / UC Diagnoses   Final diagnoses:  Viral URI with cough     Discharge Instructions     Rest. Fluids.  Medications as prescribed.  Take care  Dr. Adriana Simas    ED Prescriptions    Medication Sig Dispense Auth. Provider   ipratropium (ATROVENT) 0.06 % nasal spray Place 2 sprays into both nostrils 4 (four) times daily as needed for rhinitis. 15 mL Remi Rester G, DO   predniSONE (DELTASONE) 50 MG tablet 1 tablet daily x 5 days 5 tablet Jodeci Roarty G, DO   benzonatate (TESSALON) 100 MG capsule Take 1 capsule (100 mg total) by mouth 3 (three) times daily as needed. 30 capsule Tommie Sams, DO     Controlled Substance Prescriptions [place] Controlled Substance Registry consulted? Not Applicable   Tommie Sams, DO 12/06/18 1230

## 2018-12-09 ENCOUNTER — Emergency Department: Payer: 59

## 2018-12-09 ENCOUNTER — Other Ambulatory Visit: Payer: Self-pay

## 2018-12-09 ENCOUNTER — Encounter: Payer: Self-pay | Admitting: *Deleted

## 2018-12-09 ENCOUNTER — Emergency Department
Admission: EM | Admit: 2018-12-09 | Discharge: 2018-12-09 | Disposition: A | Discharge status: Home / Self Care | Payer: 59 | Attending: Emergency Medicine | Admitting: Emergency Medicine

## 2018-12-09 DIAGNOSIS — J101 Influenza due to other identified influenza virus with other respiratory manifestations: Secondary | ICD-10-CM | POA: Diagnosis not present

## 2018-12-09 DIAGNOSIS — R030 Elevated blood-pressure reading, without diagnosis of hypertension: Secondary | ICD-10-CM | POA: Diagnosis not present

## 2018-12-09 DIAGNOSIS — R05 Cough: Secondary | ICD-10-CM | POA: Diagnosis present

## 2018-12-09 LAB — INFLUENZA PANEL BY PCR (TYPE A & B)
Influenza A By PCR: NEGATIVE
Influenza B By PCR: POSITIVE — AB

## 2018-12-09 MED ORDER — BENZONATATE 100 MG PO CAPS: 100.0000 mg | ORAL_CAPSULE | Freq: Three times a day (TID) | ORAL | 0 refills | Status: DC | PRN

## 2018-12-09 MED ORDER — BENZONATATE 100 MG PO CAPS
100.0000 mg | ORAL_CAPSULE | Freq: Once | ORAL | Status: AC
  Administered 2018-12-09: 100 mg via ORAL
  Filled 2018-12-09: qty 1

## 2018-12-09 NOTE — ED Notes (Signed)
Patient transported to X-ray 

## 2018-12-09 NOTE — ED Provider Notes (Signed)
[facility] Emergency Department Provider Note    First MD Initiated Contact with Patient 12/09/18 [phone]     (approximate)  I have reviewed the triage vital signs and the nursing notes.   HISTORY  Chief Complaint Cough    HPI Pollie Friarrma F Mcbeth is a 58 y.o. female non-smoker with below list of chronic medical conditions presents to the emergency department with 5-day history of productive cough with yellow sputum generalized body aches subjective fevers.  Patient states that she was seen in urgent care 5 days ago and diagnosed with a cold she said no studies were performed at that time including influenza.  Patient denies leaving influenza vaccine this year.  Patient states that symptoms are not controlled over-the-counter medications.   Past Medical History:  Diagnosis Date  . Arthritis   . Chronic back pain   . Depression   . Edema    legs/feet  . Endometriosis   . GERD (gastroesophageal reflux disease)   . History of kidney stones   . kidney stones   . Tremors of nervous system     Patient Active Problem List   Diagnosis Date Noted  . History of kidney stones   . Endometriosis   . Depression   . Chronic back pain     Past Surgical History:  Procedure Laterality Date  . ABDOMINAL HYSTERECTOMY  1993   for endometriosis/pelvic pain  . BREAST BIOPSY Right 2012   Dr Colette RibasByrd  . CATARACT EXTRACTION W/PHACO Left 07/24/2015   Procedure: CATARACT EXTRACTION PHACO AND INTRAOCULAR LENS PLACEMENT (IOC);  Surgeon: Lia HoppingNisha Mukherjee, MD;  Location: [facility] ORS;  Service: Ophthalmology;  Laterality: Left;  US: 00:29.0 AP%: 12.7 CDE: 3.68 Lot # [id number]  . CATARACT EXTRACTION W/PHACO Right 03/10/2017   Procedure: CATARACT EXTRACTION PHACO AND INTRAOCULAR LENS PLACEMENT (IOC);  Surgeon: Nevada CraneBradley Mark King, MD;  Location: [facility] ORS;  Service: Ophthalmology;  Laterality: Right;  US 23.7 AP% 5.7 CDE 1.34 Fluid Pack lot # [id number]  . KIDNEY SURGERY     stent  . KNEE  ARTHROSCOPY Right   . NASAL SINUS SURGERY    . SALPINGOOPHORECTOMY Right 1993  . SALPINGOOPHORECTOMY Left 2009  . TONSILLECTOMY      Prior to Admission medications   Medication Sig Start Date End Date Taking? Authorizing Provider  benzonatate (TESSALON PERLES) 100 MG capsule Take 1 capsule (100 mg total) by mouth 3 (three) times daily as needed for up to 30 doses for cough. 12/09/18   Darci CurrentBrown, [place] N, MD  benzonatate (TESSALON) 100 MG capsule Take 1 capsule (100 mg total) by mouth 3 (three) times daily as needed. 12/06/18   Tommie Samsook, Jayce G, DO  estradiol (ESTRACE) 1 MG tablet Take 1 tablet (1 mg total) by mouth daily. 04/14/18   Farrel ConnersGutierrez, Colleen, CNM  ipratropium (ATROVENT) 0.06 % nasal spray Place 2 sprays into both nostrils 4 (four) times daily as needed for rhinitis. 12/06/18   Tommie Samsook, Jayce G, DO  oxyCODONE-acetaminophen (PERCOCET) 10-325 MG tablet Take by mouth. 12/15/16   [provider]  predniSONE (DELTASONE) 50 MG tablet 1 tablet daily x 5 days 12/06/18   Tommie Samsook, Jayce G, DO    Allergies Amoxicillin-pot clavulanate; Azithromycin; and Triamcinolone  Family History  Problem Relation Age of Onset  . Osteoporosis Mother   . Hyperlipidemia Mother   . Coronary artery disease Maternal Grandmother   . Stomach cancer Maternal Grandmother 60  . Breast cancer Paternal Aunt 40  . Cervical cancer Other 22  Social History Social History   Tobacco Use  . Smoking status: Never Smoker  . Smokeless tobacco: Never Used  Substance Use Topics  . Alcohol use: No  . Drug use: Yes    Comment: pain clinic prescribes percocets    Review of Systems Constitutional: Positive for subjective fevers Eyes: No visual changes. ENT: No sore throat. Cardiovascular: Denies chest pain. Respiratory: Denies shortness of breath.  Positive for cough Gastrointestinal: No abdominal pain.  No nausea, no vomiting.  No diarrhea.  No constipation. Genitourinary: Negative for dysuria. Musculoskeletal:  Positive for generalized muscle aches Integumentary: Negative for rash. Neurological: Negative for headaches, focal weakness or numbness.   ____________________________________________   PHYSICAL EXAM:  VITAL SIGNS: ED Triage Vitals  Enc Vitals Group     BP 12/09/18 0420 (!) 171/95     Pulse Rate 12/09/18 0420 (!) 102     Resp 12/09/18 0420 16     Temp 12/09/18 0420 99.6 F (37.6 C)     Temp Source 12/09/18 0420 Oral     SpO2 12/09/18 0420 100 %     Weight --      Height --      Head Circumference --      Peak Flow --      Pain Score 12/09/18 0706 7     Pain Loc --      Pain Edu? --      Excl. in GC? --     Constitutional: Alert and oriented. Well appearing and in no acute distress. Eyes: Conjunctivae are normal.  Mouth/Throat: Mucous membranes are moist.  Oropharynx non-erythematous. Neck: No stridor.  Cardiovascular: Normal rate, regular rhythm. Good peripheral circulation. Grossly normal heart sounds. Respiratory: Normal respiratory effort.  No retractions. Lungs CTAB. Gastrointestinal: Soft and nontender. No distention.  Musculoskeletal: No lower extremity tenderness nor edema. No gross deformities of extremities. Neurologic:  Normal speech and language. No gross focal neurologic deficits are appreciated.  Skin:  Skin is warm, dry and intact. No rash noted. Psychiatric: Mood and affect are normal. Speech and behavior are normal.  ____________________________________________   LABS (all labs ordered are listed, but only abnormal results are displayed)  Labs Reviewed  INFLUENZA PANEL BY PCR (TYPE A & B) - Abnormal; Notable for the following components:      Result Value   Influenza B By PCR POSITIVE (*)    All other components within normal limits   _____________________ Official radiology report(s): Dg Chest 2 View  Result Date: 12/09/2018 CLINICAL DATA:  Productive cough and headache. EXAM: CHEST - 2 VIEW COMPARISON:  Chest x-ray dated 04/29/2012.  FINDINGS: Heart size and mediastinal contours are within normal limits. Lungs are clear. No pleural effusion or pneumothorax seen. No acute or suspicious osseous finding. Stable benign bone infarct versus old enchondroma within the RIGHT humeral head/neck. IMPRESSION: No active cardiopulmonary disease. No evidence of pneumonia or pulmonary edema. Electronically Signed   By: Bary Richard M.D.   On: 12/09/2018 05:14      Procedures   ____________________________________________   INITIAL IMPRESSION / ASSESSMENT AND PLAN / ED COURSE  As part of my medical decision making, I reviewed the following data within the electronic MEDICAL RECORD NUMBER   58 year old female presenting with above-stated history and physical exam with differential diagnosis including pneumonia colitis and most likely influenza.  Influenza positive for influenza B.  Chest x-ray revealed no evidence of pneumonia.  Spoke with the patient at length regarding management.  Patient given Jerilynn Som in  the emergency department will be prescribed the same for home. ____________________________________________  FINAL CLINICAL IMPRESSION(S) / ED DIAGNOSES  Final diagnoses:  Influenza B     MEDICATIONS GIVEN DURING THIS VISIT:  Medications  benzonatate (TESSALON) capsule 100 mg (100 mg Oral Given 12/09/18 0646)     ED Discharge Orders         Ordered    benzonatate (TESSALON PERLES) 100 MG capsule  3 times daily PRN     12/09/18 0654           Note:  This document was prepared using Dragon voice recognition software and may include unintentional dictation errors.    Darci CurrentBrown, [place] N, MD 12/09/18 2250

## 2018-12-09 NOTE — ED Notes (Signed)
Report to jennifer, rn.  

## 2018-12-09 NOTE — ED Triage Notes (Signed)
Pt reports since Tuesday she has had hoarseness, productive yellow sputum cough, and headache. Denies fevers. No meds PTA.

## 2018-12-26 DIAGNOSIS — M545 Low back pain: Secondary | ICD-10-CM | POA: Diagnosis not present

## 2018-12-26 DIAGNOSIS — M79604 Pain in right leg: Secondary | ICD-10-CM | POA: Diagnosis not present

## 2018-12-26 DIAGNOSIS — Z79891 Long term (current) use of opiate analgesic: Secondary | ICD-10-CM | POA: Diagnosis not present

## 2018-12-26 DIAGNOSIS — M79605 Pain in left leg: Secondary | ICD-10-CM | POA: Diagnosis not present

## 2018-12-26 DIAGNOSIS — G8929 Other chronic pain: Secondary | ICD-10-CM | POA: Diagnosis not present

## 2018-12-26 DIAGNOSIS — G894 Chronic pain syndrome: Secondary | ICD-10-CM | POA: Diagnosis not present

## 2019-01-23 DIAGNOSIS — M79605 Pain in left leg: Secondary | ICD-10-CM | POA: Diagnosis not present

## 2019-01-23 DIAGNOSIS — M545 Low back pain: Secondary | ICD-10-CM | POA: Diagnosis not present

## 2019-01-23 DIAGNOSIS — Z79891 Long term (current) use of opiate analgesic: Secondary | ICD-10-CM | POA: Diagnosis not present

## 2019-01-23 DIAGNOSIS — G894 Chronic pain syndrome: Secondary | ICD-10-CM | POA: Diagnosis not present

## 2019-01-23 DIAGNOSIS — M79604 Pain in right leg: Secondary | ICD-10-CM | POA: Diagnosis not present

## 2019-01-23 DIAGNOSIS — G8929 Other chronic pain: Secondary | ICD-10-CM | POA: Diagnosis not present

## 2019-02-20 DIAGNOSIS — Z79891 Long term (current) use of opiate analgesic: Secondary | ICD-10-CM | POA: Diagnosis not present

## 2019-02-20 DIAGNOSIS — G8929 Other chronic pain: Secondary | ICD-10-CM | POA: Diagnosis not present

## 2019-02-20 DIAGNOSIS — M79604 Pain in right leg: Secondary | ICD-10-CM | POA: Diagnosis not present

## 2019-02-20 DIAGNOSIS — G894 Chronic pain syndrome: Secondary | ICD-10-CM | POA: Diagnosis not present

## 2019-02-20 DIAGNOSIS — M79661 Pain in right lower leg: Secondary | ICD-10-CM | POA: Diagnosis not present

## 2019-02-20 DIAGNOSIS — M79605 Pain in left leg: Secondary | ICD-10-CM | POA: Diagnosis not present

## 2019-03-17 DIAGNOSIS — K0889 Other specified disorders of teeth and supporting structures: Secondary | ICD-10-CM | POA: Diagnosis not present

## 2019-03-17 DIAGNOSIS — K047 Periapical abscess without sinus: Secondary | ICD-10-CM | POA: Diagnosis not present

## 2019-03-22 DIAGNOSIS — M545 Low back pain: Secondary | ICD-10-CM | POA: Diagnosis not present

## 2019-03-22 DIAGNOSIS — G894 Chronic pain syndrome: Secondary | ICD-10-CM | POA: Diagnosis not present

## 2019-03-22 DIAGNOSIS — M79662 Pain in left lower leg: Secondary | ICD-10-CM | POA: Diagnosis not present

## 2019-03-22 DIAGNOSIS — Z79891 Long term (current) use of opiate analgesic: Secondary | ICD-10-CM | POA: Diagnosis not present

## 2019-03-22 DIAGNOSIS — G8929 Other chronic pain: Secondary | ICD-10-CM | POA: Diagnosis not present

## 2019-03-22 DIAGNOSIS — M79661 Pain in right lower leg: Secondary | ICD-10-CM | POA: Diagnosis not present

## 2019-03-23 ENCOUNTER — Other Ambulatory Visit: Payer: Self-pay | Admitting: Certified Nurse Midwife

## 2019-03-27 IMAGING — CR DG HIP (WITH OR WITHOUT PELVIS) 2-3V*R*
3 series · 3 of 3 positions shown · non-contrast
Comparison: None.

CLINICAL DATA: Right-sided groin and lower abdominal pain since
[REDACTED].

EXAM:
DG HIP (WITH OR WITHOUT PELVIS) 2-3V RIGHT

[pelvis ap]
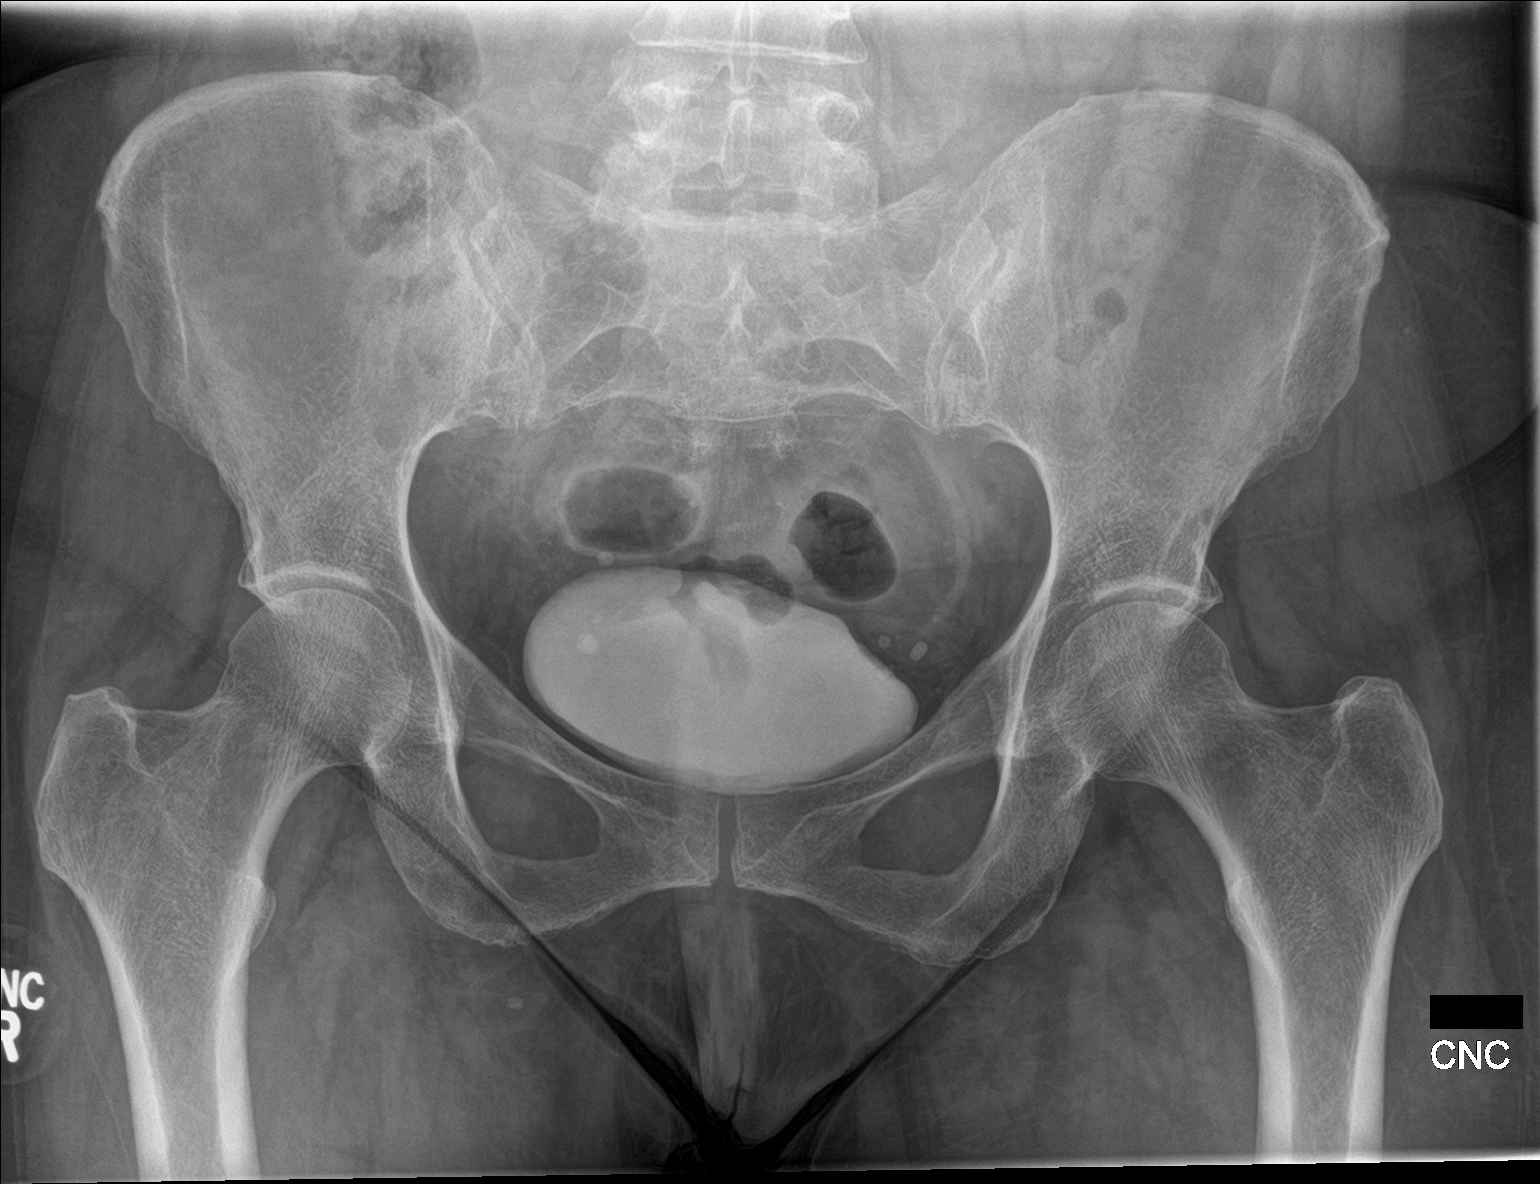

[hip ap]
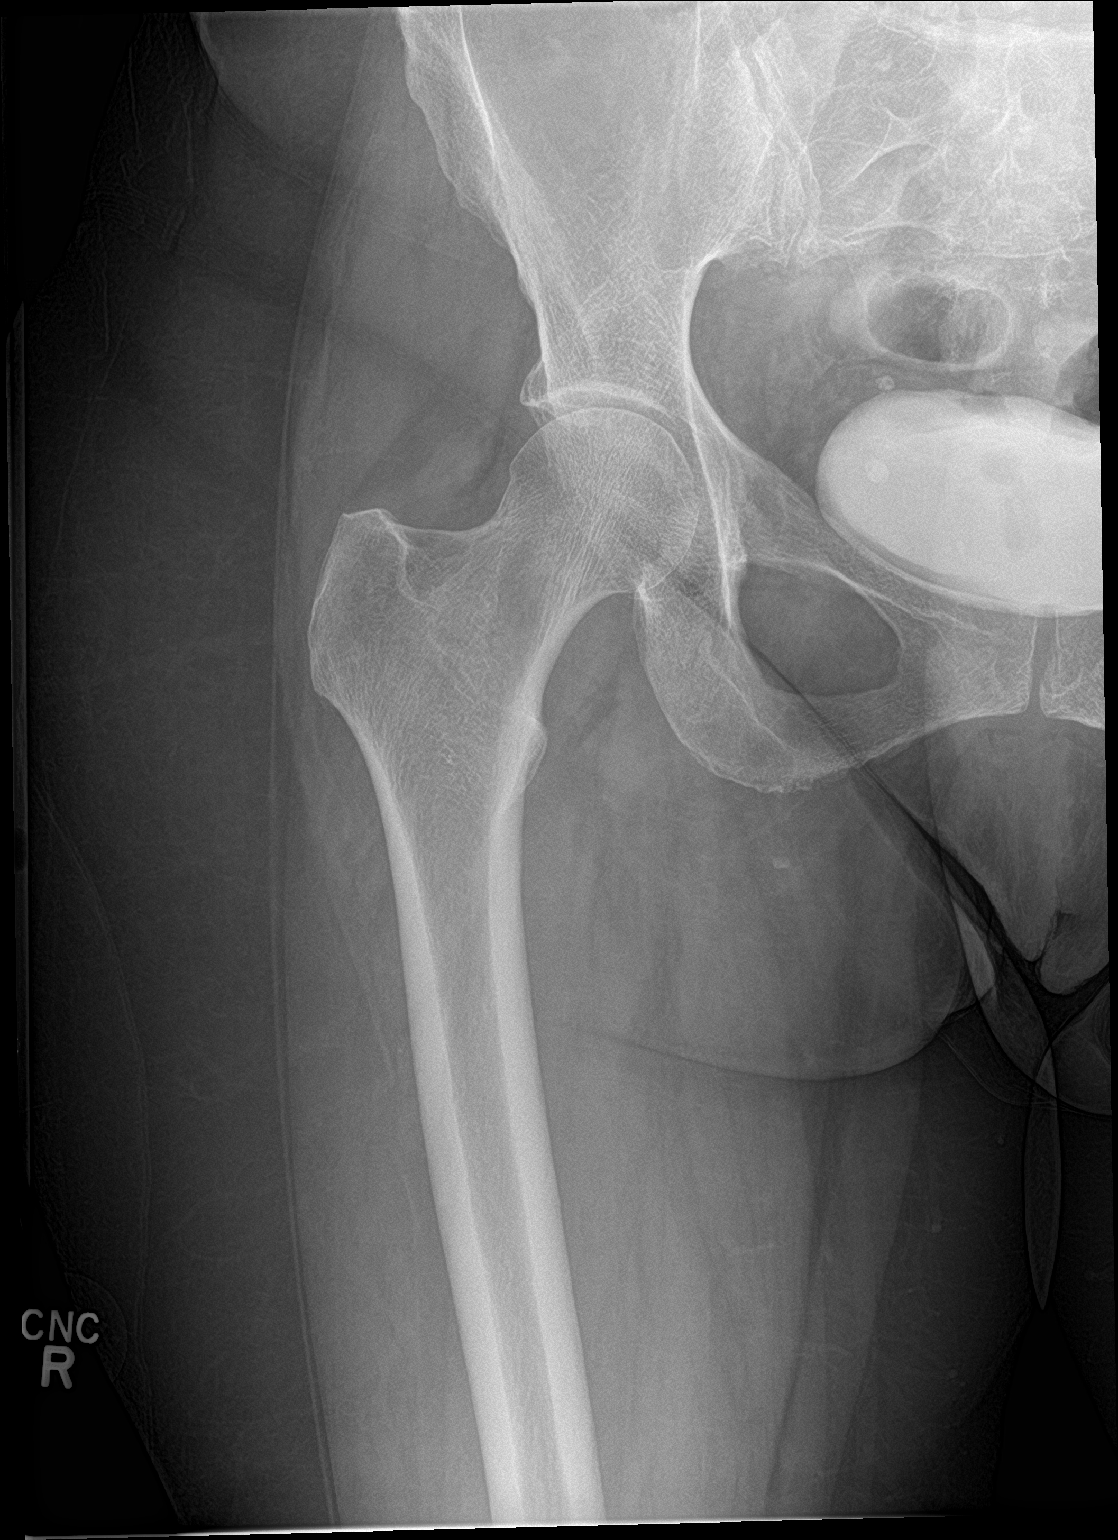

[hip lat]
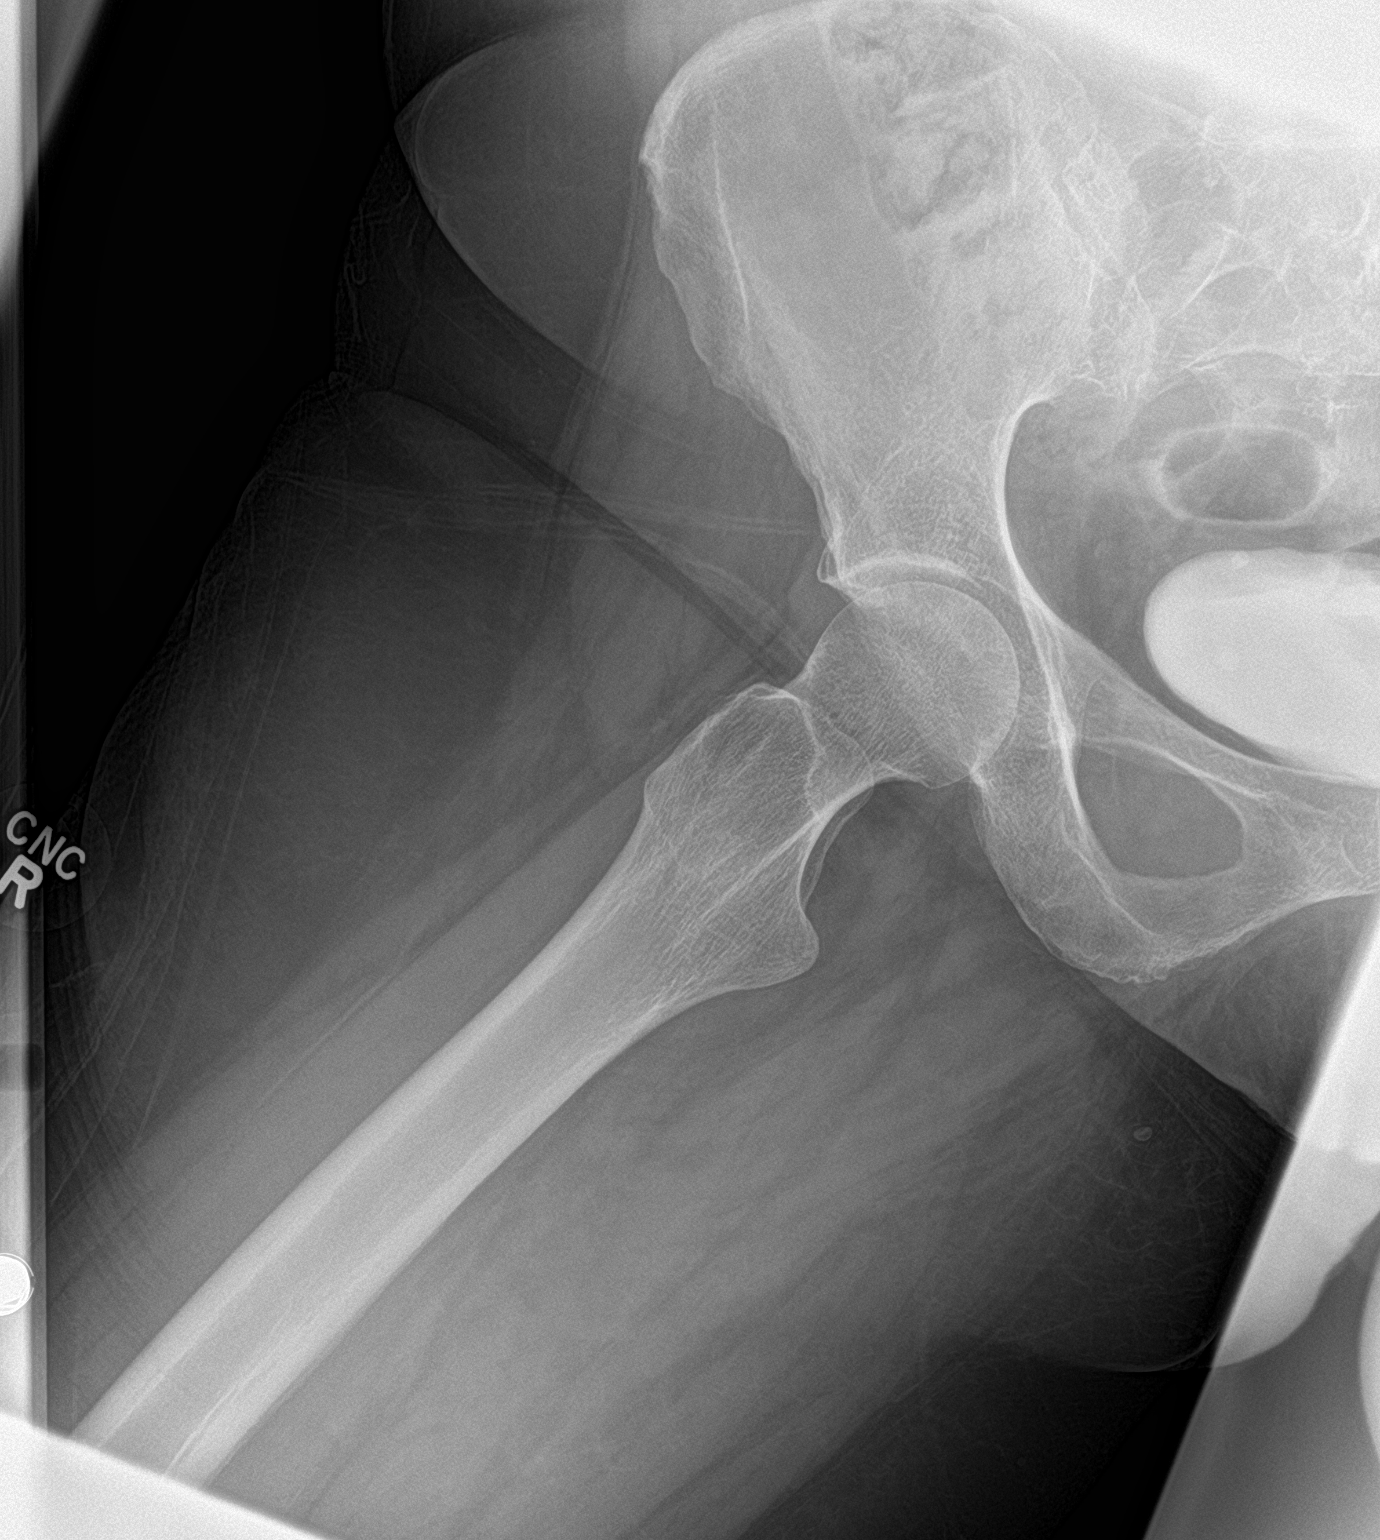

[3 of 3 positions shown; findings below may reference images not displayed]

FINDINGS: There is no evidence of hip fracture or dislocation. There is no
evidence of arthropathy or other focal bone abnormality. Contrast in
the urinary bladder from abdominal CT earlier today.
IMPRESSION: Negative.

## 2019-08-02 ENCOUNTER — Telehealth: Payer: Self-pay

## 2019-08-02 ENCOUNTER — Other Ambulatory Visit: Payer: Self-pay | Admitting: Certified Nurse Midwife

## 2019-08-02 MED ORDER — ESTRADIOL 1 MG PO TABS: 1.0000 mg | ORAL_TABLET | Freq: Every day | ORAL | 0 refills | Status: DC

## 2019-08-02 NOTE — Telephone Encounter (Signed)
Let Alison Rivas know that she does not need a Pap smear, but she still needs a breast exam yearly and a colon cancer screen yearly, especially when she is on estrogen replacement. These can be done by her PCP and then he can order her estrogen. By the way she has not had her mammogram done since 2016. Will refill her estrogen for 3 months, but she does need to be seen for further estrogen refills.

## 2019-08-02 NOTE — Telephone Encounter (Signed)
Patient saw CLG for AE 03/2018. States she was advised she didn't have to be seen every year since she had a hysterectomy. She has been out of refills for 3 months. Requesting refill of  estradiol (ESTRACE) 1 MG tablet  To [facility] [place].

## 2019-08-03 NOTE — Telephone Encounter (Signed)
Pt aware.

## 2019-11-01 ENCOUNTER — Other Ambulatory Visit: Payer: Self-pay | Admitting: Certified Nurse Midwife

## 2020-03-28 ENCOUNTER — Telehealth: Payer: Self-pay

## 2020-03-28 ENCOUNTER — Other Ambulatory Visit: Payer: Self-pay | Admitting: Certified Nurse Midwife

## 2020-03-28 MED ORDER — ESTRADIOL 1 MG PO TABS: 1.0000 mg | ORAL_TABLET | Freq: Every day | ORAL | 0 refills | Status: DC

## 2020-03-28 NOTE — Telephone Encounter (Signed)
Patient reports CLG advised her she could start going to her PCP to get her Estradiol refilled. She saw her PCP, but he will not rx/rf her Estradiol. Requesting refill of Estradiol. She states her menopause is going completely crazy. She's having 10-15 hot flashes a day. JS#[phone]. Ok to leave message.

## 2020-03-28 NOTE — Telephone Encounter (Signed)
Her PCP gave her one month supply of estradiol. She needs to come in for a breast exam and follow up appointment with me if she wants to continue getting her refills thru [place]. Last exam was 2 years ago. Will refill for another 2 months so she can get an appointment. Can also talk to her about other options for treatment of hot flashes.

## 2020-03-31 NOTE — Telephone Encounter (Signed)
LMVM to relay CLG response to patient. Advised to contact office to schedule apt.

## 2020-05-20 ENCOUNTER — Telehealth: Payer: Self-pay

## 2020-05-20 NOTE — Telephone Encounter (Signed)
Pt calling for phone number to mammogram place so she can schedule appt.  [phone]  Adv pt she needed to be seen before order placed.  They won't schedule her unless she has been seen within the last year.  Tx'd to [place] for scheduling.

## 2020-05-22 ENCOUNTER — Other Ambulatory Visit: Payer: Self-pay | Admitting: Certified Nurse Midwife
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
## 2020-06-25 ENCOUNTER — Other Ambulatory Visit: Payer: Self-pay

## 2020-06-25 ENCOUNTER — Ambulatory Visit (INDEPENDENT_AMBULATORY_CARE_PROVIDER_SITE_OTHER): Payer: BLUE CROSS/BLUE SHIELD | Admitting: Certified Nurse Midwife

## 2020-06-25 ENCOUNTER — Encounter: Payer: Self-pay | Admitting: Certified Nurse Midwife

## 2020-06-25 VITALS — BP 149/86 | HR 72 | Ht 68.0 in | Wt 193.0 lb

## 2020-06-25 DIAGNOSIS — Z01419 Encounter for gynecological examination (general) (routine) without abnormal findings: Secondary | ICD-10-CM | POA: Diagnosis not present

## 2020-06-25 DIAGNOSIS — N951 Menopausal and female climacteric states: Secondary | ICD-10-CM | POA: Diagnosis not present

## 2020-06-25 DIAGNOSIS — Z79890 Hormone replacement therapy (postmenopausal): Secondary | ICD-10-CM

## 2020-06-25 DIAGNOSIS — Z1231 Encounter for screening mammogram for malignant neoplasm of breast: Secondary | ICD-10-CM | POA: Diagnosis not present

## 2020-06-25 MED ORDER — ESTRADIOL 1 MG PO TABS: 1.0000 mg | ORAL_TABLET | Freq: Every day | ORAL | 3 refills | Status: DC

## 2020-07-17 IMAGING — CR DG CHEST 2V
2 series · 2 of 2 positions shown · non-contrast
Comparison: Chest x-ray dated 04/29/2012.

CLINICAL DATA: Productive cough and headache.

EXAM:
CHEST - 2 VIEW

[chest pa]
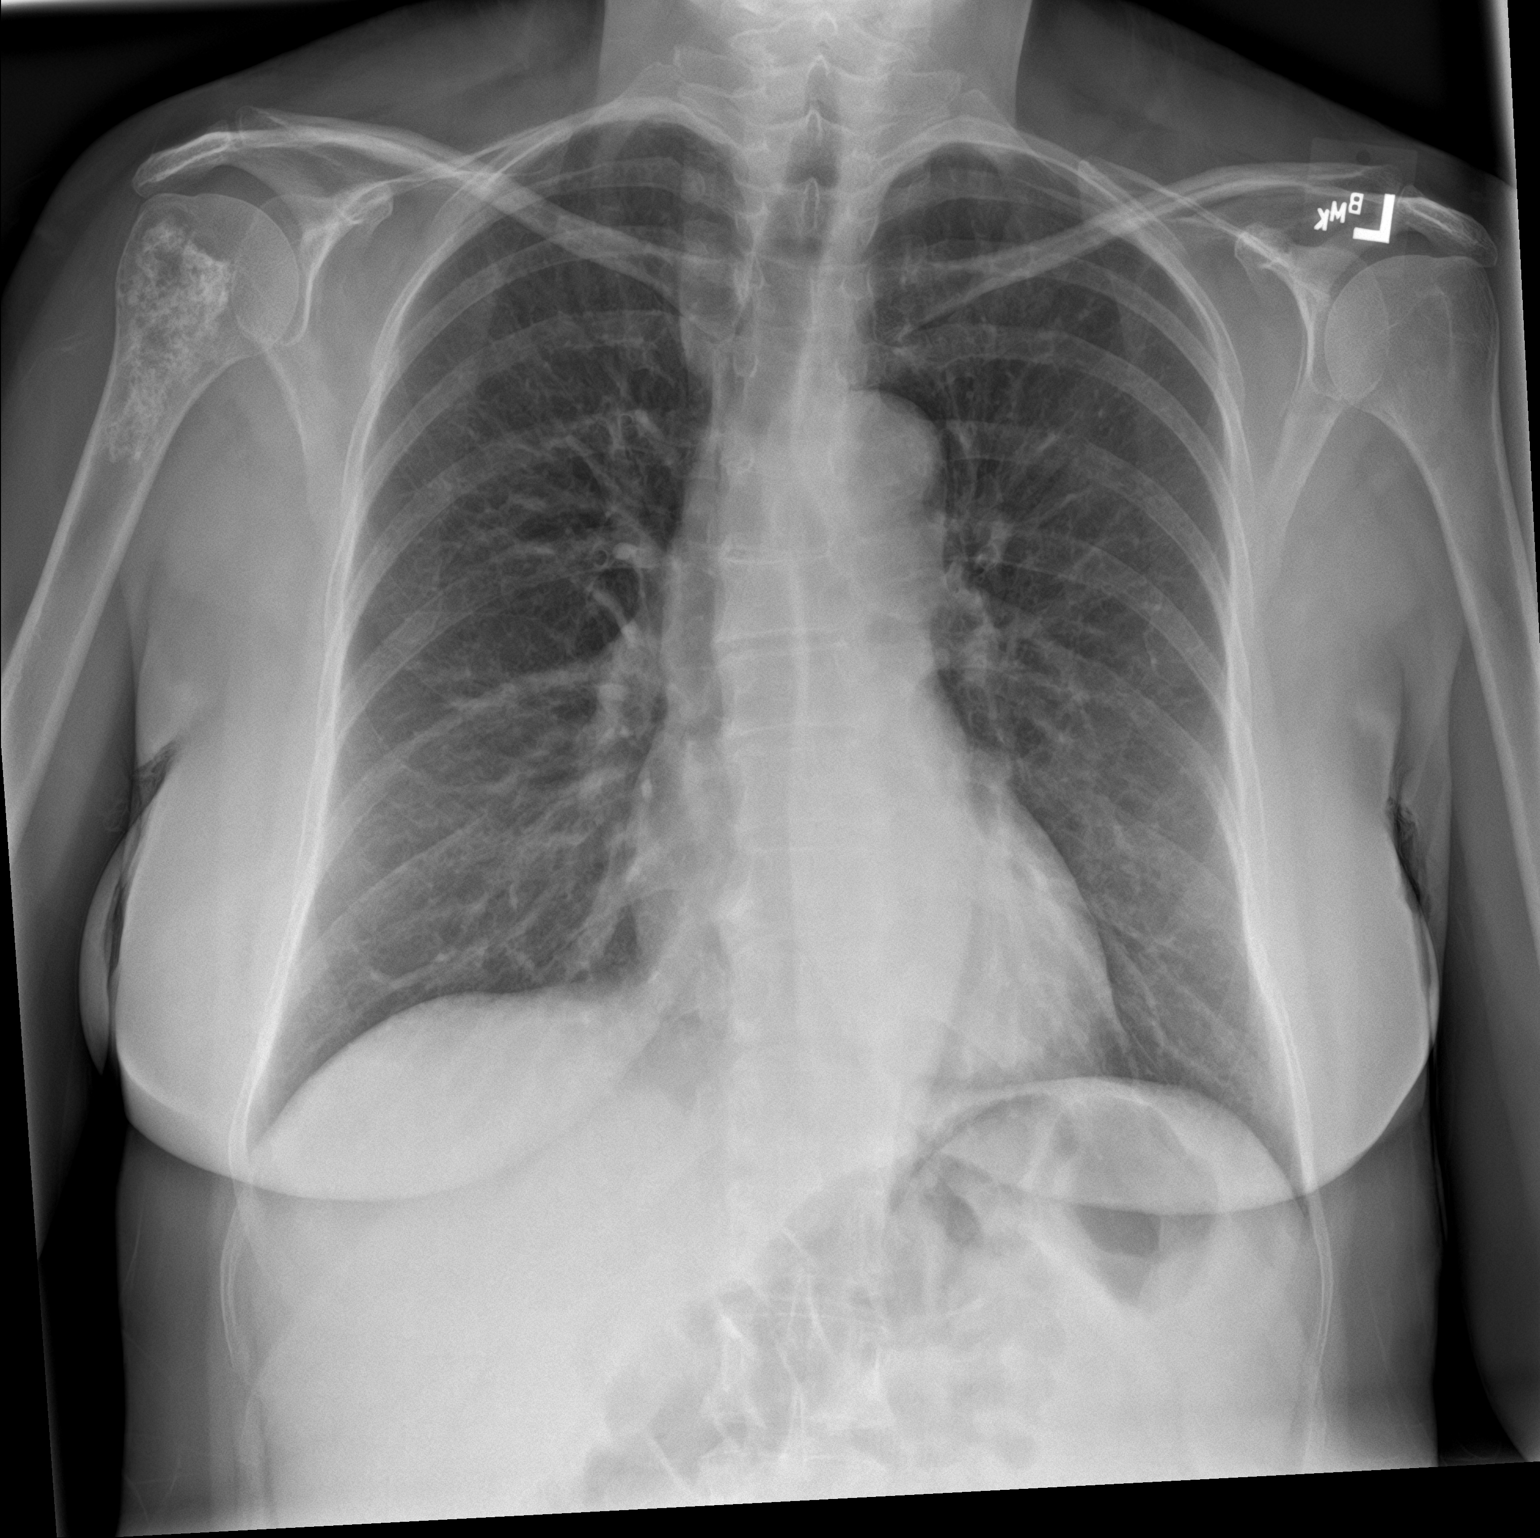

[chest lat]
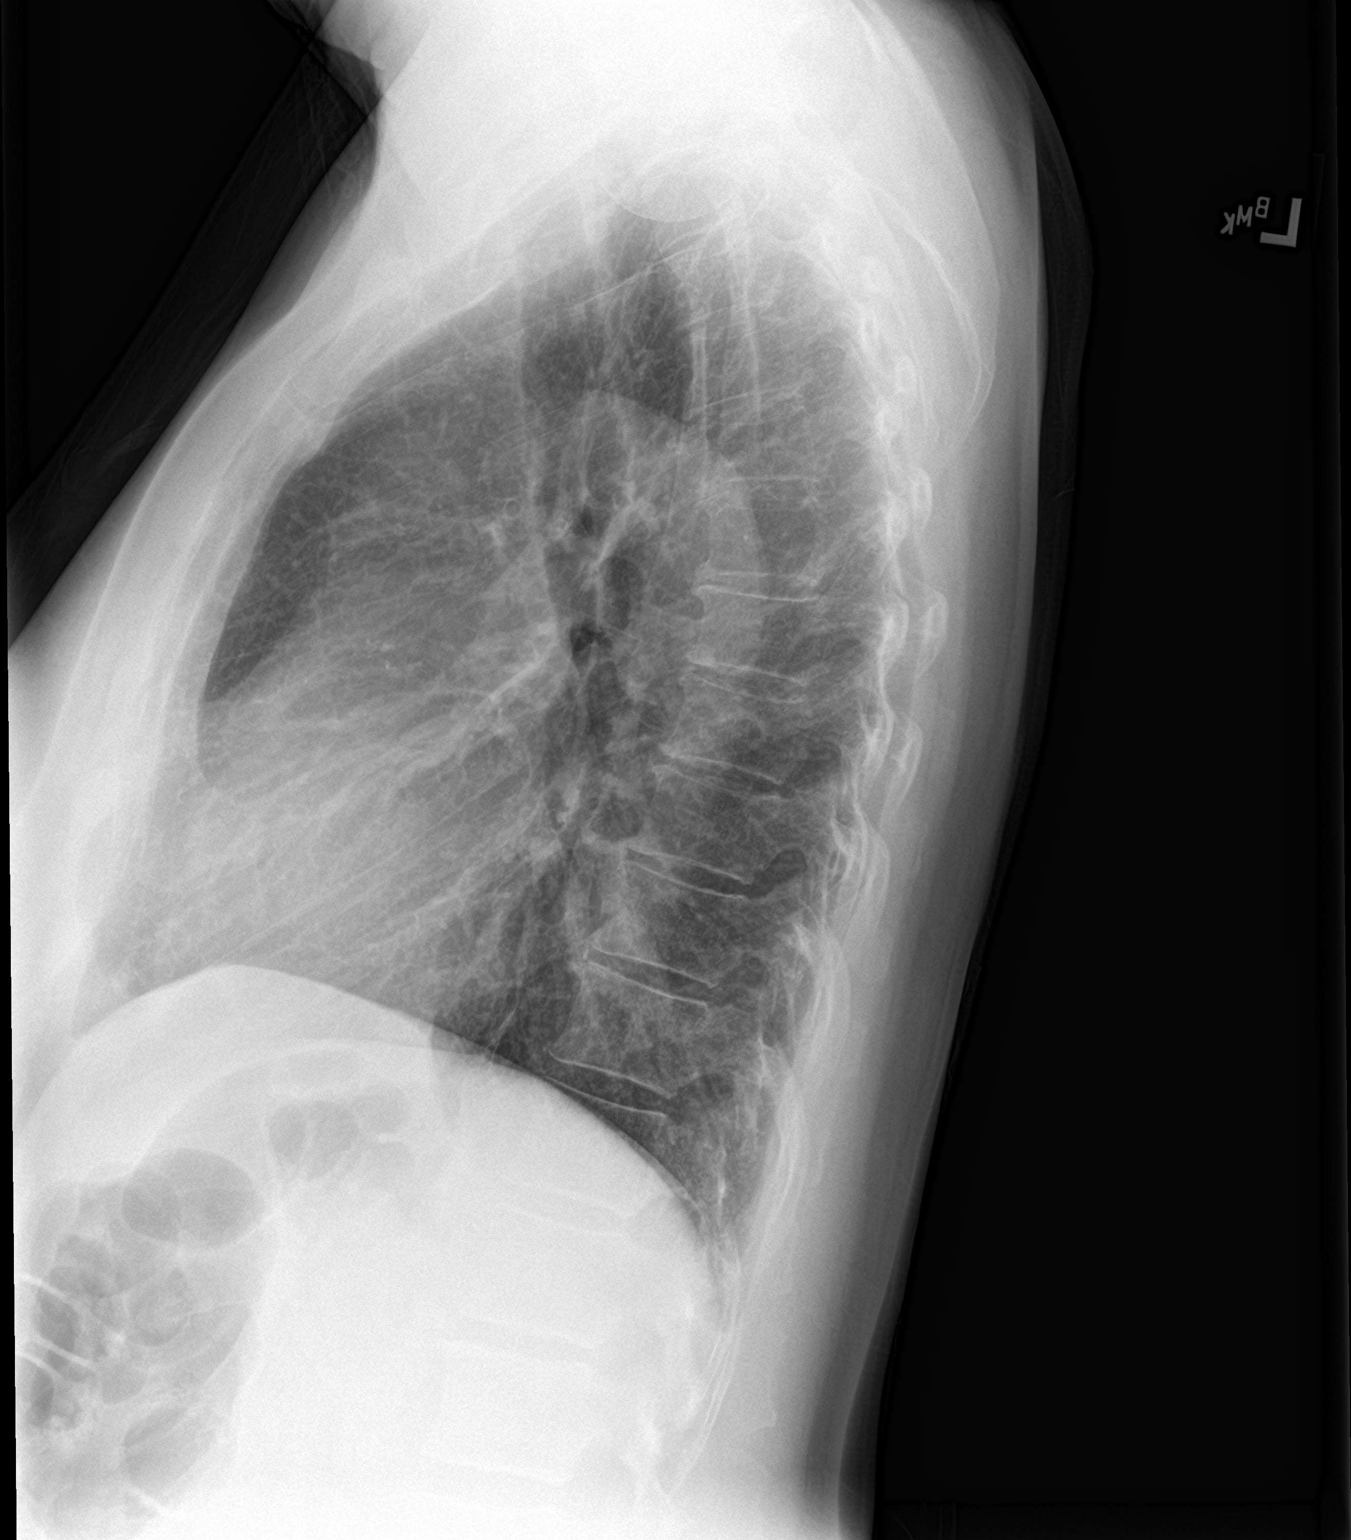

[2 of 2 positions shown; findings below may reference images not displayed]

FINDINGS: Heart size and mediastinal contours are within normal limits. Lungs
are clear. No pleural effusion or pneumothorax seen. No acute or
suspicious osseous finding. Stable benign bone infarct versus old
enchondroma within the RIGHT humeral head/neck.
IMPRESSION: No active cardiopulmonary disease. No evidence of pneumonia or
pulmonary edema.

## 2021-04-23 DIAGNOSIS — M5441 Lumbago with sciatica, right side: Secondary | ICD-10-CM | POA: Diagnosis not present

## 2021-04-23 DIAGNOSIS — M955 Acquired deformity of pelvis: Secondary | ICD-10-CM | POA: Diagnosis not present

## 2021-04-23 DIAGNOSIS — M9903 Segmental and somatic dysfunction of lumbar region: Secondary | ICD-10-CM | POA: Diagnosis not present

## 2021-04-23 DIAGNOSIS — M9905 Segmental and somatic dysfunction of pelvic region: Secondary | ICD-10-CM | POA: Diagnosis not present

## 2021-05-28 DIAGNOSIS — M5441 Lumbago with sciatica, right side: Secondary | ICD-10-CM | POA: Diagnosis not present

## 2021-05-28 DIAGNOSIS — M9903 Segmental and somatic dysfunction of lumbar region: Secondary | ICD-10-CM | POA: Diagnosis not present

## 2021-05-28 DIAGNOSIS — M955 Acquired deformity of pelvis: Secondary | ICD-10-CM | POA: Diagnosis not present

## 2021-05-28 DIAGNOSIS — M9905 Segmental and somatic dysfunction of pelvic region: Secondary | ICD-10-CM | POA: Diagnosis not present

## 2021-06-15 DIAGNOSIS — M955 Acquired deformity of pelvis: Secondary | ICD-10-CM | POA: Diagnosis not present

## 2021-06-15 DIAGNOSIS — M9905 Segmental and somatic dysfunction of pelvic region: Secondary | ICD-10-CM | POA: Diagnosis not present

## 2021-06-15 DIAGNOSIS — M9903 Segmental and somatic dysfunction of lumbar region: Secondary | ICD-10-CM | POA: Diagnosis not present

## 2021-06-15 DIAGNOSIS — M5441 Lumbago with sciatica, right side: Secondary | ICD-10-CM | POA: Diagnosis not present

## 2021-07-01 DIAGNOSIS — M9903 Segmental and somatic dysfunction of lumbar region: Secondary | ICD-10-CM | POA: Diagnosis not present

## 2021-07-01 DIAGNOSIS — M5441 Lumbago with sciatica, right side: Secondary | ICD-10-CM | POA: Diagnosis not present

## 2021-07-01 DIAGNOSIS — M9905 Segmental and somatic dysfunction of pelvic region: Secondary | ICD-10-CM | POA: Diagnosis not present

## 2021-07-01 DIAGNOSIS — M955 Acquired deformity of pelvis: Secondary | ICD-10-CM | POA: Diagnosis not present

## 2021-07-24 ENCOUNTER — Encounter: Payer: Self-pay | Admitting: Obstetrics and Gynecology

## 2021-07-24 ENCOUNTER — Ambulatory Visit (INDEPENDENT_AMBULATORY_CARE_PROVIDER_SITE_OTHER): Payer: BC Managed Care – PPO | Admitting: Obstetrics and Gynecology

## 2021-07-24 ENCOUNTER — Other Ambulatory Visit: Payer: Self-pay

## 2021-07-24 VITALS — BP 120/74 | Ht 68.0 in | Wt 188.6 lb

## 2021-07-24 DIAGNOSIS — Z1231 Encounter for screening mammogram for malignant neoplasm of breast: Secondary | ICD-10-CM | POA: Diagnosis not present

## 2021-07-24 DIAGNOSIS — Z79890 Hormone replacement therapy (postmenopausal): Secondary | ICD-10-CM

## 2021-07-24 DIAGNOSIS — Z1211 Encounter for screening for malignant neoplasm of colon: Secondary | ICD-10-CM

## 2021-07-24 DIAGNOSIS — Z124 Encounter for screening for malignant neoplasm of cervix: Secondary | ICD-10-CM | POA: Diagnosis not present

## 2021-07-24 DIAGNOSIS — Z01419 Encounter for gynecological examination (general) (routine) without abnormal findings: Secondary | ICD-10-CM | POA: Diagnosis not present

## 2021-07-24 DIAGNOSIS — Z0000 Encounter for general adult medical examination without abnormal findings: Secondary | ICD-10-CM

## 2021-07-24 MED ORDER — ESTRADIOL 1 MG PO TABS: 1.0000 mg | ORAL_TABLET | Freq: Every day | ORAL | 3 refills | Status: DC

## 2021-07-24 NOTE — Patient Instructions (Signed)
[employer] Recommended Dietary Allowances for Calcium and Vitamin D  Age (yr) Calcium Recommended Dietary Allowance (mg/day) Vitamin D Recommended Dietary Allowance (international units/day)  9-18 1,300 600  19-50 1,000 600  51-70 1,200 600  71 and older 1,200 800  Data from [employer]. Dietary reference intakes: calcium, vitamin D. [place], [place]: National [employer]ies [employer]; 2011.   Exercising to Stay Healthy To become healthy and stay healthy, it is recommended that you do moderate-intensity and vigorous-intensity exercise. You can tell that you are exercising at a moderate intensity if your heart starts beating faster and you start breathing faster but can still hold a conversation. You can tell that you are exercising at a vigorous intensity if you are breathing much harder and faster and cannot hold a conversation while exercising. How can exercise benefit me? Exercising regularly is important. It has many health benefits, such as: Improving overall fitness, flexibility, and endurance. Increasing bone density. Helping with weight control. Decreasing body fat. Increasing muscle strength and endurance. Reducing stress and tension, anxiety, depression, or anger. Improving overall health. What guidelines should I follow while exercising? Before you start a new exercise program, talk with your health care provider. Do not exercise so much that you hurt yourself, feel dizzy, or get very short of breath. Wear comfortable clothes and wear shoes with good support. Drink plenty of water while you exercise to prevent dehydration or heat stroke. Work out until your breathing and your heartbeat get faster (moderate intensity). How often should I exercise? Choose an activity that you enjoy, and set realistic goals. Your health care provider can help you make an activity plan that is individually designed and works best for you. Exercise regularly as told by your health  care provider. This may include: Doing strength training two times a week, such as: Lifting weights. Using resistance bands. Push-ups. Sit-ups. Yoga. Doing a certain intensity of exercise for a given amount of time. Choose from these options: A total of 150 minutes of moderate-intensity exercise every week. A total of 75 minutes of vigorous-intensity exercise every week. A mix of moderate-intensity and vigorous-intensity exercise every week. Children, pregnant women, people who have not exercised regularly, people who are overweight, and older adults may need to talk with a health care provider about what activities are safe to perform. If you have a medical condition, be sure to talk with your health care provider before you start a new exercise program. What are some exercise ideas? Moderate-intensity exercise ideas include: Walking 1 mile (1.6 km) in about 15 minutes. Biking. Hiking. Golfing. Dancing. Water aerobics. Vigorous-intensity exercise ideas include: Walking 4.5 miles (7.2 km) or more in about 1 hour. Jogging or running 5 miles (8 km) in about 1 hour. Biking 10 miles (16.1 km) or more in about 1 hour. Lap swimming. Roller-skating or in-line skating. Cross-country skiing. Vigorous competitive sports, such as football, basketball, and soccer. Jumping rope. Aerobic dancing. What are some everyday activities that can help me get exercise? Yard work, such as: Pushing a lawn mower. Raking and bagging leaves. Washing your car. Pushing a stroller. Shoveling snow. Gardening. Washing windows or floors. How can I be more active in my day-to-day activities? Use stairs instead of an elevator. Take a walk during your lunch break. If you drive, park your car farther away from your work or school. If you take public transportation, get off one stop early and walk the rest of the way. Stand up or walk around during all of   your indoor phone calls. Get up, stretch, and walk  around every 30 minutes throughout the day. Enjoy exercise with a friend. Support to continue exercising will help you keep a regular routine of activity. Where to find more information You can find more information about exercising to stay healthy from: [place] Department of Health and Human Services: www.hhs.gov Centers for Disease Control and Prevention (CDC): www.cdc.gov Summary Exercising regularly is important. It will improve your overall fitness, flexibility, and endurance. Regular exercise will also improve your overall health. It can help you control your weight, reduce stress, and improve your bone density. Do not exercise so much that you hurt yourself, feel dizzy, or get very short of breath. Before you start a new exercise program, talk with your health care provider. This information is not intended to replace advice given to you by your health care provider. Make sure you discuss any questions you have with your health care provider. Document Revised: 02/27/2021 Document Reviewed: 02/27/2021 Elsevier Patient Education  2022 [employer] Budget-Friendly Healthy Eating There are many ways to save money at the grocery store and continue to eat healthy. You can be successful if you: Plan meals according to your budget. Make a grocery list and only purchase food according to your grocery list. Prepare food yourself at home. What are tips for following this plan? Reading food labels Compare food labels between brand name foods and the store brand. Often the nutritional value is the same, but the store brand is lower cost. Look for products that do not have added sugar, fat, or salt (sodium). These often cost the same but are healthier for you. Products may be labeled as: Sugar-free. Nonfat. Low-fat. Sodium-free. Low-sodium. Look for lean ground beef labeled as at least 92% lean and 8% fat. Shopping  Buy only the items on your grocery list and go only to the areas of the store  that have the items on your list. Use coupons only for foods and brands you normally buy. Avoid buying items you wouldn't normally buy simply because they are on sale. Check online and in newspapers for weekly deals. Buy healthy items from the bulk bins when available, such as herbs, spices, flour, pasta, nuts, and dried fruit. Buy fruits and vegetables that are in season. Prices are usually lower on in-season produce. Look at the unit price on the price tag. Use it to compare different brands and sizes to find out which item is the best deal. Choose healthy items that are often low-cost, such as carrots, potatoes, apples, bananas, and oranges. Dried or canned beans are a low-cost protein source. Buy in bulk and freeze extra food. Items you can buy in bulk include meats, fish, poultry, frozen fruits, and frozen vegetables. Avoid buying "ready-to-eat" foods, such as pre-cut fruits and vegetables and pre-made salads. If possible, shop around to discover where you can find the best prices. Consider other retailers such as dollar stores, larger wholesale stores, local fruit and vegetable stands, and farmers markets. Do not shop when you are hungry. If you shop while hungry, it may be hard to stick to your list and budget. Resist impulse buying. Use your grocery list as your official plan for the week. Buy a variety of vegetables and fruits by purchasing fresh, frozen, and canned items. Look at the top and bottom shelves for deals. Foods at eye level (eye level of an adult or child) are usually more expensive. Be efficient with your time when shopping. The more time you   spend at the store, the more money you are likely to spend. To save money when choosing more expensive foods like meats and dairy: Choose cheaper cuts of meat, such as bone-in chicken thighs and drumsticks instead of skinless and boneless chicken. When you are ready to prepare the chicken, you can remove the skin yourself to make it  healthier. Choose lean meats like chicken or turkey instead of beef. Choose canned seafood, such as tuna, salmon, or sardines. Buy eggs as a low-cost source of protein. Buy dried beans and peas, such as lentils, split peas, or kidney beans instead of meats. Dried beans and peas are a good alternative source of protein. Buy the larger tubs of yogurt instead of individual-sized containers. Choose water instead of sodas and other sweetened beverages. Avoid buying chips, cookies, and other "junk food." These items are usually expensive and not healthy. Cooking Make extra food and freeze the extras in meal-sized containers or in individual portions for fast meals and snacks. Pre-cook on days when you have extra time to prepare meals in advance. You can keep these meals in the fridge or freezer and reheat for a quick meal. When you come home from the grocery store, wash, peel, and cut fruits and vegetables so they are ready to use and eat. This will help reduce food waste. Meal planning Do not eat out or get fast food. Prepare food at home. Make a grocery list and make sure to bring it with you to the store. If you have a smart phone, you could use your phone to create your shopping list. Plan meals and snacks according to a grocery list and budget you create. Use leftovers in your meal plan for the week. Look for recipes where you can cook once and make enough food for two meals. Prepare budget-friendly types of meals like stews, casseroles, and stir-fry dishes. Try some meatless meals or try "no cook" meals like salads. Make sure that half your plate is filled with fruits or vegetables. Choose from fresh, frozen, or canned fruits and vegetables. If eating canned, remember to rinse them before eating. This will remove any excess salt added for packaging. Summary Eating healthy on a budget is possible if you plan your meals according to your budget, purchase according to your budget and grocery list,  and prepare food yourself. Tips for buying more food on a limited budget include buying generic brands, using coupons only for foods you normally buy, and buying healthy items from the bulk bins when available. Tips for buying cheaper food to replace expensive food include choosing cheaper, lean cuts of meat, and buying dried beans and peas. This information is not intended to replace advice given to you by your health care provider. Make sure you discuss any questions you have with your health care provider. Document Revised: 08/14/2020 Document Reviewed: 08/14/2020 Elsevier Patient Education  2022 [employer]. Bone Health Bones protect organs, store calcium, anchor muscles, and support the whole body. Keeping your bones strong is important, especially as you get older. You can take actions to help keep your bones strong and healthy. Why is keeping my bones healthy important? Keeping your bones healthy is important because your body constantly replaces bone cells. Cells get old, and new cells take their place. As we age, we lose bone cells because the body may not be able to make enough new cells to replace the old cells. The amount of bone cells and bone tissue you have is referred to as   bone mass. The higher your bone mass, the stronger your bones. The aging process leads to an overall loss of bone mass in the body, which can increase the likelihood of: Joint pain and stiffness. Broken bones. A condition in which the bones become weak and brittle (osteoporosis). A large decline in bone mass occurs in older adults. In women, it occurs about the time of menopause. What actions can I take to keep my bones healthy? Good health habits are important for maintaining healthy bones. This includes eating nutritious foods and exercising regularly. To have healthy bones, you need to get enough of the right minerals and vitamins. Most nutrition experts recommend getting these nutrients from the foods that  you eat. In some cases, taking supplements may also be recommended. Doing certain types of exercise is also important for bone health. What are the nutritional recommendations for healthy bones? Eating a well-balanced diet with plenty of calcium and vitamin D will help to protect your bones. Nutritional recommendations vary from person to person. Ask your health care provider what is healthy for you. Here are some general guidelines. Get enough calcium Calcium is the most important (essential) mineral for bone health. Most people can get enough calcium from their diet, but supplements may be recommended for people who are at risk for osteoporosis. Good sources of calcium include: Dairy products, such as low-fat or nonfat milk, cheese, and yogurt. Dark green leafy vegetables, such as bok choy and broccoli. Calcium-fortified foods, such as orange juice, cereal, bread, soy beverages, and tofu products. Nuts, such as almonds. Follow these recommended amounts for daily calcium intake: Children, age 1-3: 700 mg. Children, age 4-8: 1,000 mg. Children, age 9-13: 1,300 mg. Teens, age 14-18: 1,300 mg. Adults, age 19-50: 1,000 mg. Adults, age 51-70: Men: 1,000 mg. Women: 1,200 mg. Adults, age 71 or older: 1,200 mg. Pregnant and breastfeeding females: Teens: 1,300 mg. Adults: 1,000 mg. Get enough vitamin D Vitamin D is the most essential vitamin for bone health. It helps the body absorb calcium. Sunlight stimulates the skin to make vitamin D, so be sure to get enough sunlight. If you live in a cold climate or you do not get outside often, your health care provider may recommend that you take vitamin D supplements. Good sources of vitamin D in your diet include: Egg yolks. Saltwater fish. Milk and cereal fortified with vitamin D. Follow these recommended amounts for daily vitamin D intake: Children and teens, age 1-18: 600 international units. Adults, age 50 or younger: 400-800 international  units. Adults, age 51 or older: 800-1,000 international units. Get other important nutrients Other nutrients that are important for bone health include: Phosphorus. This mineral is found in meat, poultry, dairy foods, nuts, and legumes. The recommended daily intake for adult men and adult women is 700 mg. Magnesium. This mineral is found in seeds, nuts, dark green vegetables, and legumes. The recommended daily intake for adult men is 400-420 mg. For adult women, it is 310-320 mg. Vitamin K. This vitamin is found in green leafy vegetables. The recommended daily intake is 120 mg for adult men and 90 mg for adult women. What type of physical activity is best for building and maintaining healthy bones? Weight-bearing and strength-building activities are important for building and maintaining healthy bones. Weight-bearing activities cause muscles and bones to work against gravity. Strength-building activities increase the strength of the muscles that support bones. Weight-bearing and muscle-building activities include: Walking and hiking. Jogging and running. Dancing. Gym exercises. Lifting weights. Tennis   and racquetball. Climbing stairs. Aerobics. Adults should get at least 30 minutes of moderate physical activity on most days. Children should get at least 60 minutes of moderate physical activity on most days. Ask your health care provider what type of exercise is best for you. How can I find out if my bone mass is low? Bone mass can be measured with an X-ray test called a bone mineral density (BMD) test. This test is recommended for all women who are age 65 or older. It may also be recommended for: Men who are age 70 or older. People who are at risk for osteoporosis because of: Having bones that break easily. Having a long-term disease that weakens bones, such as kidney disease or rheumatoid arthritis. Having menopause earlier than normal. Taking medicine that weakens bones, such as steroids,  thyroid hormones, or hormone treatment for breast cancer or prostate cancer. Smoking. Drinking three or more alcoholic drinks a day. If you find that you have a low bone mass, you may be able to prevent osteoporosis or further bone loss by changing your diet and lifestyle. Where can I find more information? For more information, check out the following websites: National Osteoporosis Foundation: www.nof.org/patients National Institutes of Health: www.bones.nih.gov International Osteoporosis Foundation: www.iofbonehealth.org Summary The aging process leads to an overall loss of bone mass in the body, which can increase the likelihood of broken bones and osteoporosis. Eating a well-balanced diet with plenty of calcium and vitamin D will help to protect your bones. Weight-bearing and strength-building activities are also important for building and maintaining strong bones. Bone mass can be measured with an X-ray test called a bone mineral density (BMD) test. This information is not intended to replace advice given to you by your health care provider. Make sure you discuss any questions you have with your health care provider. Document Revised: 11/28/2017 Document Reviewed: 11/28/2017 Elsevier Patient Education  2022 [employer] Inc.  

## 2021-07-24 NOTE — Progress Notes (Signed)
Gynecology Annual Exam  PCP: [facility]  Chief Complaint:  Chief Complaint  Patient presents with   Gynecologic Exam    History of Present Illness: Patient is a 60 y.o. [id number] presents for annual exam. The patient has no complaints today.   LMP: No LMP recorded. Patient has had a hysterectomy. She denies postmenopausal bleeding or spotting  The patient is sexually active. She denies dyspareunia.  Postcoital Bleeding: no   The patient does perform self breast exams.  There is notable family history of breast or ovarian cancer in her family.  The patient has regular exercise: none  The patient denies current symptoms of depression.   Review of Systems: Review of Systems  Constitutional:  Negative for chills, fever, malaise/fatigue and weight loss.  HENT:  Negative for congestion, hearing loss and sinus pain.   Eyes:  Negative for blurred vision and double vision.  Respiratory:  Negative for cough, sputum production, shortness of breath and wheezing.   Cardiovascular:  Negative for chest pain, palpitations, orthopnea and leg swelling.  Gastrointestinal:  Negative for abdominal pain, constipation, diarrhea, nausea and vomiting.  Genitourinary:  Negative for dysuria, flank pain, frequency, hematuria and urgency.  Musculoskeletal:  Negative for back pain, falls and joint pain.  Skin:  Negative for itching and rash.  Neurological:  Negative for dizziness and headaches.  Psychiatric/Behavioral:  Negative for depression, substance abuse and suicidal ideas. The patient is not nervous/anxious.    Past Medical History:  Past Medical History:  Diagnosis Date   Arthritis    Chronic back pain    Depression    Edema    legs/feet   Endometriosis    GERD (gastroesophageal reflux disease)    History of kidney stones    kidney stones    Tremors of nervous system     Past Surgical History:  Past Surgical History:  Procedure Laterality Date   ABDOMINAL  HYSTERECTOMY  1993   for endometriosis/pelvic pain   BREAST BIOPSY Right 2012   Dr Colette Ribas   CATARACT EXTRACTION [facility] Left 07/24/2015   Procedure: CATARACT EXTRACTION PHACO AND INTRAOCULAR LENS PLACEMENT (IOC);  Surgeon: Lia Hopping, MD;  Location: [facility] ORS;  Service: Ophthalmology;  Laterality: Left;  [place]: 00:29.0 AP%: 12.7 CDE: 3.68 Lot # [id number]   CATARACT EXTRACTION W/PHACO Right 03/10/2017   Procedure: CATARACT EXTRACTION PHACO AND INTRAOCULAR LENS PLACEMENT (IOC);  Surgeon: Nevada Crane, MD;  Location: [facility] ORS;  Service: Ophthalmology;  Laterality: Right;  [place] 23.7 AP% 5.7 CDE 1.34 Fluid Pack lot # [id number]   KIDNEY SURGERY     stent   KNEE ARTHROSCOPY Right    NASAL SINUS SURGERY     SALPINGOOPHORECTOMY Right 1993   SALPINGOOPHORECTOMY Left 2009   TONSILLECTOMY      Gynecologic History:  No LMP recorded. Patient has had a hysterectomy. Reports that prior to her hysterectomy she had no history of abnormal Pap smears.  Last mammogram: Mammogram records were not seen.  Patient reports she last had a mammogram 1 to 2 years ago.  Obstetric History: [id number]   Family History:  Family History  Problem Relation Age of Onset   Osteoporosis Mother    Hyperlipidemia Mother    Coronary artery disease Maternal Grandmother    Stomach cancer Maternal Grandmother 51   Breast cancer Paternal Aunt 63   Cervical cancer Other 26   Cancer Niece 2       unkown   Non-Hodgkin's lymphoma Granddaughter 23  Social History:  Social History   Socioeconomic History   Marital status: Married    Spouse name: Not on file   Number of children: 2   Years of education: Not on file   Highest education level: Not on file  Occupational History   Not on file  Tobacco Use   Smoking status: Never   Smokeless tobacco: Never  Vaping Use   Vaping Use: Never used  Substance and Sexual Activity   Alcohol use: No   Drug use: Not Currently    Comment: pain clinic prescribes percocets    Sexual activity: Yes    Partners: Male    Birth control/protection: Post-menopausal, Surgical    Comment: hyst  Other Topics Concern   Not on file  Social History Narrative   Not on file   Social Determinants of Health   Financial Resource Strain: Not on file  Food Insecurity: Not on file  Transportation Needs: Not on file  Physical Activity: Not on file  Stress: Not on file  Social Connections: Not on file  Intimate Partner Violence: Not on file    Allergies:  Allergies  Allergen Reactions   Amoxicillin-Pot Clavulanate Diarrhea   Azithromycin Hives and Itching   Triamcinolone Itching    Medications: Prior to Admission medications   Medication Sig Start Date End Date Taking? Authorizing Provider  estradiol (ESTRACE) 1 MG tablet Take 1 tablet (1 mg total) by mouth daily. 06/25/20  Yes Farrel Conners, CNM  oxyCODONE-acetaminophen (PERCOCET) 10-325 MG tablet Take by mouth. 12/15/16  Yes [provider]    Physical Exam Vitals: Blood pressure 120/74, height 5\' 8"  (1.727 m), weight 188 lb 9.6 oz (85.5 kg).  Physical Exam Constitutional:      Appearance: Normal appearance. She is well-developed.  Genitourinary:     Genitourinary Comments: External: Normal appearing vulva. No lesions noted.  Speculum examination: not performed Bimanual examination: Uterus absent. No adnexal masses. No adnexal tenderness. Pelvis not fixed. Cervix absent. Normal vaginal cuff palpated.  Breast Exam: breast equal without skin changes, nipple discharge, breast lump or enlarged lymph nodes   HENT:     Head: Normocephalic and atraumatic.  Eyes:     Extraocular Movements: Extraocular movements intact.     Pupils: Pupils are equal, round, and reactive to light.  Neck:     Thyroid: No thyromegaly.  Cardiovascular:     Rate and Rhythm: Normal rate and regular rhythm.     Heart sounds: Normal heart sounds.  Pulmonary:     Effort: Pulmonary effort is normal.     Breath sounds:  Normal breath sounds.  Abdominal:     General: Bowel sounds are normal. There is no distension.     Palpations: Abdomen is soft. There is no mass.  Musculoskeletal:     Cervical back: Neck supple.  Neurological:     Mental Status: She is alert and oriented to person, place, and time.  Skin:    General: Skin is warm and dry.  Psychiatric:        Behavior: Behavior normal.        Thought Content: Thought content normal.        Judgment: Judgment normal.  Vitals reviewed.     Female chaperone present for pelvic and breast  portions of the physical exam  Assessment: 60 y.o. 67 routine annual exam  Plan: Problem List Items Addressed This Visit   None Visit Diagnoses     Encounter for annual routine gynecological examination    -  Primary   Health maintenance examination       Breast cancer screening by mammogram       Relevant Orders   MM 3D SCREEN BREAST BILATERAL   Cervical cancer screening       Hormone replacement therapy       Relevant Medications   estradiol (ESTRACE) 1 MG tablet       1) Mammogram - recommend yearly screening mammogram.  Mammogram Was ordered today  2) STI screening was offered and declined  3) ASCCP guidelines and rational discussed.  She would like to discontinue Pap smears  4) Colonoscopy --   patient declines colonoscopy.  She reports she had this once in her 50s and is very unpleasant.  She does not desire to have colonoscopies again.  She has done Hemoccult testing in the past.  I recommended regular  a Cologuard and she was willing to do this, order was placed.  5) Routine healthcare maintenance including cholesterol, diabetes screening discussed managed by PCP  6) Osteoporosis screening -not at increased risk start at age 24.  7) Patient has been on hormone replacement therapy since her bilateral oophorectomy in her 30s.  She currently takes 1 mg of estradiol.  She would like to continue hormone replacement therapy at this time.  She  is trying to wean off in the past and had many hot flashes.  We discussed considering weaning off after the age of 88 and that the dose is generally tapered prior to discontinuing.   Adelene Idler MD, [facility], [facility] 07/24/2021 3:13 PM

## 2021-08-04 DIAGNOSIS — M5441 Lumbago with sciatica, right side: Secondary | ICD-10-CM | POA: Diagnosis not present

## 2021-08-04 DIAGNOSIS — M9905 Segmental and somatic dysfunction of pelvic region: Secondary | ICD-10-CM | POA: Diagnosis not present

## 2021-08-04 DIAGNOSIS — M955 Acquired deformity of pelvis: Secondary | ICD-10-CM | POA: Diagnosis not present

## 2021-08-04 DIAGNOSIS — M9903 Segmental and somatic dysfunction of lumbar region: Secondary | ICD-10-CM | POA: Diagnosis not present

## 2021-09-01 DIAGNOSIS — M5441 Lumbago with sciatica, right side: Secondary | ICD-10-CM | POA: Diagnosis not present

## 2021-09-01 DIAGNOSIS — M955 Acquired deformity of pelvis: Secondary | ICD-10-CM | POA: Diagnosis not present

## 2021-09-01 DIAGNOSIS — M9903 Segmental and somatic dysfunction of lumbar region: Secondary | ICD-10-CM | POA: Diagnosis not present

## 2021-09-01 DIAGNOSIS — M9905 Segmental and somatic dysfunction of pelvic region: Secondary | ICD-10-CM | POA: Diagnosis not present

## 2021-10-06 DIAGNOSIS — M5441 Lumbago with sciatica, right side: Secondary | ICD-10-CM | POA: Diagnosis not present

## 2021-10-06 DIAGNOSIS — M9903 Segmental and somatic dysfunction of lumbar region: Secondary | ICD-10-CM | POA: Diagnosis not present

## 2021-10-06 DIAGNOSIS — M9905 Segmental and somatic dysfunction of pelvic region: Secondary | ICD-10-CM | POA: Diagnosis not present

## 2021-10-06 DIAGNOSIS — M955 Acquired deformity of pelvis: Secondary | ICD-10-CM | POA: Diagnosis not present

## 2021-12-20 DIAGNOSIS — Z03818 Encounter for observation for suspected exposure to other biological agents ruled out: Secondary | ICD-10-CM | POA: Diagnosis not present

## 2021-12-20 DIAGNOSIS — J40 Bronchitis, not specified as acute or chronic: Secondary | ICD-10-CM | POA: Diagnosis not present

## 2021-12-20 DIAGNOSIS — R051 Acute cough: Secondary | ICD-10-CM | POA: Diagnosis not present

## 2022-02-02 DIAGNOSIS — M9903 Segmental and somatic dysfunction of lumbar region: Secondary | ICD-10-CM | POA: Diagnosis not present

## 2022-02-02 DIAGNOSIS — M955 Acquired deformity of pelvis: Secondary | ICD-10-CM | POA: Diagnosis not present

## 2022-02-02 DIAGNOSIS — M9905 Segmental and somatic dysfunction of pelvic region: Secondary | ICD-10-CM | POA: Diagnosis not present

## 2022-02-02 DIAGNOSIS — M5441 Lumbago with sciatica, right side: Secondary | ICD-10-CM | POA: Diagnosis not present

## 2022-03-02 DIAGNOSIS — M9903 Segmental and somatic dysfunction of lumbar region: Secondary | ICD-10-CM | POA: Diagnosis not present

## 2022-03-02 DIAGNOSIS — M5441 Lumbago with sciatica, right side: Secondary | ICD-10-CM | POA: Diagnosis not present

## 2022-03-02 DIAGNOSIS — M9905 Segmental and somatic dysfunction of pelvic region: Secondary | ICD-10-CM | POA: Diagnosis not present

## 2022-03-02 DIAGNOSIS — M955 Acquired deformity of pelvis: Secondary | ICD-10-CM | POA: Diagnosis not present

## 2022-07-20 ENCOUNTER — Telehealth: Payer: Self-pay

## 2022-07-20 NOTE — Telephone Encounter (Signed)
Fax refill request received from [facility] [place] for: estradiol (ESTRACE) 1 MG tablet  Last filled: 04/22/22 QTY: 90

## 2022-07-20 NOTE — Telephone Encounter (Signed)
Returned fax: Not Authorized with note: Patient needs appointment. Last annual 07/24/21.

## 2022-10-07 ENCOUNTER — Emergency Department
Admission: EM | Admit: 2022-10-07 | Discharge: 2022-10-07 | Discharge status: Against Medical Advice | Payer: BC Managed Care – PPO | Attending: Emergency Medicine | Admitting: Emergency Medicine

## 2022-10-07 ENCOUNTER — Emergency Department: Payer: BC Managed Care – PPO

## 2022-10-07 DIAGNOSIS — Z5321 Procedure and treatment not carried out due to patient leaving prior to being seen by health care provider: Secondary | ICD-10-CM | POA: Diagnosis not present

## 2022-10-07 DIAGNOSIS — T68XXXA Hypothermia, initial encounter: Secondary | ICD-10-CM | POA: Diagnosis not present

## 2022-10-07 DIAGNOSIS — R519 Headache, unspecified: Secondary | ICD-10-CM | POA: Insufficient documentation

## 2022-10-07 DIAGNOSIS — I10 Essential (primary) hypertension: Secondary | ICD-10-CM | POA: Diagnosis not present

## 2022-10-07 DIAGNOSIS — R079 Chest pain, unspecified: Secondary | ICD-10-CM | POA: Insufficient documentation

## 2022-10-07 DIAGNOSIS — R0789 Other chest pain: Secondary | ICD-10-CM | POA: Diagnosis not present

## 2022-10-07 LAB — BASIC METABOLIC PANEL
Anion gap: 6 (ref 5–15)
BUN: 20 mg/dL (ref 8–23)
CO2: 30 mmol/L (ref 22–32)
Calcium: 8.9 mg/dL (ref 8.9–10.3)
Chloride: 109 mmol/L (ref 98–111)
Creatinine, Ser: 0.98 mg/dL (ref 0.44–1.00)
GFR, Estimated: >60 mL/min (ref >60)
Glucose, Bld: 123 mg/dL — ABNORMAL HIGH (ref 70–99)
Potassium: 4 mmol/L (ref 3.5–5.1)
Sodium: 145 mmol/L (ref 135–145)

## 2022-10-07 LAB — CBC
HCT: 42.4 % (ref 36.0–46.0)
Hemoglobin: 13.7 g/dL (ref 12.0–15.0)
MCH: 28.5 pg (ref 26.0–34.0)
MCHC: 32.3 g/dL (ref 30.0–36.0)
MCV: 88.1 fL (ref 80.0–100.0)
Platelets: 264 10*3/uL (ref 150–400)
RBC: 4.81 MIL/uL (ref 3.87–5.11)
RDW: 12 % (ref 11.5–15.5)
WBC: 6.3 10*3/uL (ref 4.0–10.5)
nRBC: 0 % (ref 0.0–0.2)

## 2022-10-07 LAB — TROPONIN I (HIGH SENSITIVITY): Troponin I (High Sensitivity): 5 ng/L (ref <18)

## 2022-10-07 NOTE — ED Triage Notes (Signed)
Reports sharp L sided chest pain. States onset today ~1400 and has progressively worsened. Reports difficulty taking deep breath d/t pain. Pt denies n/v. Reports h/a without dizziness or vision change. Pt alert and oriented. Breathing unlabored speaking in full sentences.

## 2022-12-21 ENCOUNTER — Telehealth: Payer: Self-pay

## 2022-12-21 NOTE — Telephone Encounter (Signed)
Patient called to verify her appointment date/time. Advised  12/27/2022 2:68 PM Copland, Deirdre Evener, PA-C [facility]

## 2022-12-26 NOTE — Progress Notes (Unsigned)
PCP: [facility]   No chief complaint on file.   HPI:      Alison Rivas is a 62 y.o. [phone] whose LMP was No LMP recorded. Patient has had a hysterectomy., presents today for her annual examination.  Her menses are absent due to hyst, s/p BSO . No pMB. On estradiol 1 mg, tried to wean off in past.  She {does:18564} have vasomotor sx.   Sex activity: {sex active: 315163}. She {does:18564} have vaginal dryness.  Last Pap: [id number]  Results were: {norm/abn:16707::"no abnormalities"} /neg HPV DNA.  Hx of STDs: {STD hx:14358}  Last mammogram: {date:304500300}  Results were: {norm/abn:13465} There is no FH of breast cancer. There is no FH of ovarian cancer. The patient {does:18564} do self-breast exams.  Colonoscopy: {hx:15363}  Repeat due after 10*** years.   Tobacco use: {tob:20664} Alcohol use: {Alcohol:11675} No drug use Exercise: {exercise:31265}  She {does:18564} get adequate calcium and Vitamin D in her diet.  Labs with PCP.   Patient Active Problem List   Diagnosis Date Noted   Vasomotor symptoms due to menopause 06/25/2020   History of kidney stones    Endometriosis    Depression    Chronic back pain     Past Surgical History:  Procedure Laterality Date   ABDOMINAL HYSTERECTOMY  1993   for endometriosis/pelvic pain   BREAST BIOPSY Right 2012   Dr Felton Clinton   CATARACT EXTRACTION [facility] Left 07/24/2015   Procedure: CATARACT EXTRACTION PHACO AND INTRAOCULAR LENS PLACEMENT ([place]);  Surgeon: Lyla Glassing, MD;  Location: [facility] ORS;  Service: Ophthalmology;  Laterality: Left;  [place]: 00:29.0 AP%: 12.7 CDE: 3.68 Lot # [id number]   CATARACT EXTRACTION W/PHACO Right 03/10/2017   Procedure: CATARACT EXTRACTION PHACO AND INTRAOCULAR LENS PLACEMENT ([place]);  Surgeon: Eulogio Bear, MD;  Location: [facility] ORS;  Service: Ophthalmology;  Laterality: Right;  [place] 23.7 AP% 5.7 CDE 1.34 Fluid Pack lot # [id number]   KIDNEY SURGERY     stent   KNEE  ARTHROSCOPY Right    NASAL SINUS SURGERY     SALPINGOOPHORECTOMY Right 1993   SALPINGOOPHORECTOMY Left 2009   TONSILLECTOMY      Family History  Problem Relation Age of Onset   Osteoporosis Mother    Hyperlipidemia Mother    Coronary artery disease Maternal Grandmother    Stomach cancer Maternal Grandmother 24   Breast cancer Paternal Aunt 8   Cervical cancer Other 22   Cancer Niece 43       unkown   Non-Hodgkin's lymphoma Granddaughter 40    Social History   Socioeconomic History   Marital status: Married    Spouse name: Not on file   Number of children: 2   Years of education: Not on file   Highest education level: Not on file  Occupational History   Not on file  Tobacco Use   Smoking status: Never   Smokeless tobacco: Never  Vaping Use   Vaping Use: Never used  Substance and Sexual Activity   Alcohol use: No   Drug use: Not Currently    Comment: pain clinic prescribes percocets   Sexual activity: Yes    Partners: Male    Birth control/protection: Post-menopausal, Surgical    Comment: hyst  Other Topics Concern   Not on file  Social History Narrative   Not on file   Social Determinants of Health   Financial Resource Strain: Not on file  Food Insecurity: Not on file  Transportation Needs: Not on  file  Physical Activity: Inactive (04/14/2018)   Exercise Vital Sign    Days of Exercise per Week: 0 days    Minutes of Exercise per Session: 0 min  Stress: Stress Concern Present (04/14/2018)   [place]    Feeling of Stress : To some extent  Social Connections: Not on file  Intimate Partner Violence: Not on file     Current Outpatient Medications:    estradiol (ESTRACE) 1 MG tablet, Take 1 tablet (1 mg total) by mouth daily., Disp: 90 tablet, Rfl: 3   oxyCODONE-acetaminophen (PERCOCET) 10-325 MG tablet, Take by mouth., Disp: , Rfl:      ROS:  Review of Systems BREAST: No  symptoms    Objective: There were no vitals taken for this visit.   OBGyn Exam  Results: No results found for this or any previous visit (from the past 24 hour(s)).  Assessment/Plan:  No diagnosis found.   No orders of the defined types were placed in this encounter.           GYN counsel {counseling: 16159}    F/U  No follow-ups on file.  Xzavien Harada B. Nyheim Seufert, PA-C 12/26/2022 9:07 PM

## 2022-12-27 ENCOUNTER — Ambulatory Visit (INDEPENDENT_AMBULATORY_CARE_PROVIDER_SITE_OTHER): Payer: BC Managed Care – PPO | Admitting: Obstetrics and Gynecology

## 2022-12-27 ENCOUNTER — Encounter: Payer: Self-pay | Admitting: Obstetrics and Gynecology

## 2022-12-27 VITALS — BP 124/80 | Ht 67.0 in | Wt 186.0 lb

## 2022-12-27 DIAGNOSIS — Z01419 Encounter for gynecological examination (general) (routine) without abnormal findings: Secondary | ICD-10-CM | POA: Diagnosis not present

## 2022-12-27 DIAGNOSIS — Z79890 Hormone replacement therapy (postmenopausal): Secondary | ICD-10-CM

## 2022-12-27 DIAGNOSIS — Z803 Family history of malignant neoplasm of breast: Secondary | ICD-10-CM | POA: Insufficient documentation

## 2022-12-27 DIAGNOSIS — Z1211 Encounter for screening for malignant neoplasm of colon: Secondary | ICD-10-CM

## 2022-12-27 DIAGNOSIS — Z1231 Encounter for screening mammogram for malignant neoplasm of breast: Secondary | ICD-10-CM

## 2022-12-27 DIAGNOSIS — N951 Menopausal and female climacteric states: Secondary | ICD-10-CM

## 2022-12-27 MED ORDER — ESTRADIOL 0.5 MG PO TABS: 0.5000 mg | ORAL_TABLET | Freq: Every day | ORAL | 0 refills | Status: DC

## 2022-12-27 NOTE — Patient Instructions (Signed)
I value your feedback and you entrusting us with your care. If you get a [facility] patient survey, I would appreciate you taking the time to let us know about your experience today. Thank you! ? ? ?

## 2023-01-20 ENCOUNTER — Ambulatory Visit
Admission: RE | Admit: 2023-01-20 | Discharge: 2023-01-20 | Disposition: A | Discharge status: Home / Self Care | Payer: BC Managed Care – PPO | Source: Ambulatory Visit | Attending: Obstetrics and Gynecology | Admitting: Obstetrics and Gynecology

## 2023-01-20 DIAGNOSIS — Z803 Family history of malignant neoplasm of breast: Secondary | ICD-10-CM

## 2023-01-20 DIAGNOSIS — Z1231 Encounter for screening mammogram for malignant neoplasm of breast: Secondary | ICD-10-CM | POA: Insufficient documentation

## 2023-02-28 DIAGNOSIS — M955 Acquired deformity of pelvis: Secondary | ICD-10-CM | POA: Diagnosis not present

## 2023-02-28 DIAGNOSIS — M9905 Segmental and somatic dysfunction of pelvic region: Secondary | ICD-10-CM | POA: Diagnosis not present

## 2023-02-28 DIAGNOSIS — M9903 Segmental and somatic dysfunction of lumbar region: Secondary | ICD-10-CM | POA: Diagnosis not present

## 2023-02-28 DIAGNOSIS — M5441 Lumbago with sciatica, right side: Secondary | ICD-10-CM | POA: Diagnosis not present

## 2023-03-02 DIAGNOSIS — M5441 Lumbago with sciatica, right side: Secondary | ICD-10-CM | POA: Diagnosis not present

## 2023-03-02 DIAGNOSIS — M9905 Segmental and somatic dysfunction of pelvic region: Secondary | ICD-10-CM | POA: Diagnosis not present

## 2023-03-02 DIAGNOSIS — M9903 Segmental and somatic dysfunction of lumbar region: Secondary | ICD-10-CM | POA: Diagnosis not present

## 2023-03-02 DIAGNOSIS — M955 Acquired deformity of pelvis: Secondary | ICD-10-CM | POA: Diagnosis not present

## 2023-03-07 DIAGNOSIS — M9905 Segmental and somatic dysfunction of pelvic region: Secondary | ICD-10-CM | POA: Diagnosis not present

## 2023-03-07 DIAGNOSIS — M9903 Segmental and somatic dysfunction of lumbar region: Secondary | ICD-10-CM | POA: Diagnosis not present

## 2023-03-07 DIAGNOSIS — M5441 Lumbago with sciatica, right side: Secondary | ICD-10-CM | POA: Diagnosis not present

## 2023-03-07 DIAGNOSIS — M955 Acquired deformity of pelvis: Secondary | ICD-10-CM | POA: Diagnosis not present

## 2023-03-10 DIAGNOSIS — M9905 Segmental and somatic dysfunction of pelvic region: Secondary | ICD-10-CM | POA: Diagnosis not present

## 2023-03-10 DIAGNOSIS — M5441 Lumbago with sciatica, right side: Secondary | ICD-10-CM | POA: Diagnosis not present

## 2023-03-10 DIAGNOSIS — M955 Acquired deformity of pelvis: Secondary | ICD-10-CM | POA: Diagnosis not present

## 2023-03-10 DIAGNOSIS — M9903 Segmental and somatic dysfunction of lumbar region: Secondary | ICD-10-CM | POA: Diagnosis not present

## 2023-03-14 ENCOUNTER — Other Ambulatory Visit: Payer: Self-pay | Admitting: Obstetrics and Gynecology

## 2023-03-14 DIAGNOSIS — N951 Menopausal and female climacteric states: Secondary | ICD-10-CM

## 2023-03-14 DIAGNOSIS — M9905 Segmental and somatic dysfunction of pelvic region: Secondary | ICD-10-CM | POA: Diagnosis not present

## 2023-03-14 DIAGNOSIS — M9903 Segmental and somatic dysfunction of lumbar region: Secondary | ICD-10-CM | POA: Diagnosis not present

## 2023-03-14 DIAGNOSIS — M955 Acquired deformity of pelvis: Secondary | ICD-10-CM | POA: Diagnosis not present

## 2023-03-14 DIAGNOSIS — Z79890 Hormone replacement therapy (postmenopausal): Secondary | ICD-10-CM

## 2023-03-14 DIAGNOSIS — M5441 Lumbago with sciatica, right side: Secondary | ICD-10-CM | POA: Diagnosis not present

## 2023-03-14 NOTE — Telephone Encounter (Signed)
Pls call pt to see if 0.5 mg dose working for her VS sx. Had been on 1 mg dose and want to get her to lower dose.

## 2023-03-15 NOTE — Telephone Encounter (Signed)
Tried again, no answer, voice mail not set up. 

## 2023-03-15 NOTE — Telephone Encounter (Signed)
Called pt, no answer, unable to leave voicemail due to mailbox not set up.

## 2023-03-22 DIAGNOSIS — M9905 Segmental and somatic dysfunction of pelvic region: Secondary | ICD-10-CM | POA: Diagnosis not present

## 2023-03-22 DIAGNOSIS — M5441 Lumbago with sciatica, right side: Secondary | ICD-10-CM | POA: Diagnosis not present

## 2023-03-22 DIAGNOSIS — M9903 Segmental and somatic dysfunction of lumbar region: Secondary | ICD-10-CM | POA: Diagnosis not present

## 2023-03-22 DIAGNOSIS — M955 Acquired deformity of pelvis: Secondary | ICD-10-CM | POA: Diagnosis not present

## 2023-04-26 DIAGNOSIS — M9903 Segmental and somatic dysfunction of lumbar region: Secondary | ICD-10-CM | POA: Diagnosis not present

## 2023-04-26 DIAGNOSIS — M5117 Intervertebral disc disorders with radiculopathy, lumbosacral region: Secondary | ICD-10-CM | POA: Diagnosis not present

## 2023-04-26 DIAGNOSIS — M955 Acquired deformity of pelvis: Secondary | ICD-10-CM | POA: Diagnosis not present

## 2023-04-26 DIAGNOSIS — M9905 Segmental and somatic dysfunction of pelvic region: Secondary | ICD-10-CM | POA: Diagnosis not present

## 2023-06-07 DIAGNOSIS — M9905 Segmental and somatic dysfunction of pelvic region: Secondary | ICD-10-CM | POA: Diagnosis not present

## 2023-06-07 DIAGNOSIS — M955 Acquired deformity of pelvis: Secondary | ICD-10-CM | POA: Diagnosis not present

## 2023-06-07 DIAGNOSIS — M5117 Intervertebral disc disorders with radiculopathy, lumbosacral region: Secondary | ICD-10-CM | POA: Diagnosis not present

## 2023-06-07 DIAGNOSIS — M9903 Segmental and somatic dysfunction of lumbar region: Secondary | ICD-10-CM | POA: Diagnosis not present

## 2023-07-06 DIAGNOSIS — M9905 Segmental and somatic dysfunction of pelvic region: Secondary | ICD-10-CM | POA: Diagnosis not present

## 2023-07-06 DIAGNOSIS — M955 Acquired deformity of pelvis: Secondary | ICD-10-CM | POA: Diagnosis not present

## 2023-07-06 DIAGNOSIS — M5117 Intervertebral disc disorders with radiculopathy, lumbosacral region: Secondary | ICD-10-CM | POA: Diagnosis not present

## 2023-07-06 DIAGNOSIS — M9903 Segmental and somatic dysfunction of lumbar region: Secondary | ICD-10-CM | POA: Diagnosis not present

## 2023-07-10 DIAGNOSIS — I10 Essential (primary) hypertension: Secondary | ICD-10-CM | POA: Diagnosis not present

## 2023-07-10 DIAGNOSIS — I6523 Occlusion and stenosis of bilateral carotid arteries: Secondary | ICD-10-CM | POA: Diagnosis not present

## 2023-07-10 DIAGNOSIS — R519 Headache, unspecified: Secondary | ICD-10-CM | POA: Diagnosis not present

## 2023-08-16 DIAGNOSIS — M955 Acquired deformity of pelvis: Secondary | ICD-10-CM | POA: Diagnosis not present

## 2023-08-16 DIAGNOSIS — M9905 Segmental and somatic dysfunction of pelvic region: Secondary | ICD-10-CM | POA: Diagnosis not present

## 2023-08-16 DIAGNOSIS — M5117 Intervertebral disc disorders with radiculopathy, lumbosacral region: Secondary | ICD-10-CM | POA: Diagnosis not present

## 2023-08-16 DIAGNOSIS — M9903 Segmental and somatic dysfunction of lumbar region: Secondary | ICD-10-CM | POA: Diagnosis not present

## 2023-09-21 DIAGNOSIS — M5117 Intervertebral disc disorders with radiculopathy, lumbosacral region: Secondary | ICD-10-CM | POA: Diagnosis not present

## 2023-09-21 DIAGNOSIS — M9903 Segmental and somatic dysfunction of lumbar region: Secondary | ICD-10-CM | POA: Diagnosis not present

## 2023-09-21 DIAGNOSIS — M9905 Segmental and somatic dysfunction of pelvic region: Secondary | ICD-10-CM | POA: Diagnosis not present

## 2023-09-21 DIAGNOSIS — M955 Acquired deformity of pelvis: Secondary | ICD-10-CM | POA: Diagnosis not present

## 2023-10-26 DIAGNOSIS — M955 Acquired deformity of pelvis: Secondary | ICD-10-CM | POA: Diagnosis not present

## 2023-10-26 DIAGNOSIS — M9905 Segmental and somatic dysfunction of pelvic region: Secondary | ICD-10-CM | POA: Diagnosis not present

## 2023-10-26 DIAGNOSIS — M9903 Segmental and somatic dysfunction of lumbar region: Secondary | ICD-10-CM | POA: Diagnosis not present

## 2023-10-26 DIAGNOSIS — M5117 Intervertebral disc disorders with radiculopathy, lumbosacral region: Secondary | ICD-10-CM | POA: Diagnosis not present

## 2023-11-10 ENCOUNTER — Other Ambulatory Visit: Payer: Self-pay | Admitting: Obstetrics and Gynecology

## 2023-11-10 DIAGNOSIS — Z79890 Hormone replacement therapy (postmenopausal): Secondary | ICD-10-CM

## 2023-11-10 DIAGNOSIS — N951 Menopausal and female climacteric states: Secondary | ICD-10-CM

## 2023-12-01 ENCOUNTER — Other Ambulatory Visit: Payer: Self-pay | Admitting: Obstetrics and Gynecology

## 2023-12-01 DIAGNOSIS — N951 Menopausal and female climacteric states: Secondary | ICD-10-CM

## 2023-12-01 DIAGNOSIS — Z79890 Hormone replacement therapy (postmenopausal): Secondary | ICD-10-CM

## 2023-12-02 ENCOUNTER — Other Ambulatory Visit: Payer: Self-pay

## 2023-12-02 DIAGNOSIS — Z79890 Hormone replacement therapy (postmenopausal): Secondary | ICD-10-CM

## 2023-12-02 DIAGNOSIS — N951 Menopausal and female climacteric states: Secondary | ICD-10-CM

## 2023-12-02 MED ORDER — ESTRADIOL 0.5 MG PO TABS: 0.5000 mg | ORAL_TABLET | Freq: Every day | ORAL | 0 refills | Status: DC

## 2023-12-02 NOTE — Telephone Encounter (Signed)
Pt called triage requesting a refill on her estradiol. She has her annual set up for next month with alicia. Refill sent.

## 2023-12-09 ENCOUNTER — Other Ambulatory Visit: Payer: Self-pay | Admitting: Obstetrics and Gynecology

## 2023-12-09 DIAGNOSIS — N951 Menopausal and female climacteric states: Secondary | ICD-10-CM

## 2023-12-09 DIAGNOSIS — Z79890 Hormone replacement therapy (postmenopausal): Secondary | ICD-10-CM
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
## 2023-12-29 ENCOUNTER — Encounter: Payer: Self-pay | Admitting: Obstetrics and Gynecology

## 2023-12-29 ENCOUNTER — Ambulatory Visit (INDEPENDENT_AMBULATORY_CARE_PROVIDER_SITE_OTHER): Payer: BC Managed Care – PPO | Admitting: Obstetrics and Gynecology

## 2023-12-29 VITALS — BP 173/92 | HR 65 | Ht 68.0 in | Wt 201.4 lb

## 2023-12-29 DIAGNOSIS — Z01419 Encounter for gynecological examination (general) (routine) without abnormal findings: Secondary | ICD-10-CM

## 2023-12-29 DIAGNOSIS — Z803 Family history of malignant neoplasm of breast: Secondary | ICD-10-CM

## 2023-12-29 DIAGNOSIS — N951 Menopausal and female climacteric states: Secondary | ICD-10-CM

## 2023-12-29 DIAGNOSIS — Z1231 Encounter for screening mammogram for malignant neoplasm of breast: Secondary | ICD-10-CM

## 2023-12-29 DIAGNOSIS — Z79890 Hormone replacement therapy (postmenopausal): Secondary | ICD-10-CM

## 2023-12-29 MED ORDER — ESTRADIOL 0.5 MG PO TABS: 0.5000 mg | ORAL_TABLET | Freq: Every day | ORAL | 3 refills | Status: AC

## 2023-12-29 NOTE — Patient Instructions (Addendum)
I value your feedback and you entrusting [place] with your care. If you get a [facility] patient survey, I would appreciate you taking the time to let us know about your experience today. Thank you!  [facility] ([place])--[phone]

## 2024-01-25 DIAGNOSIS — M9903 Segmental and somatic dysfunction of lumbar region: Secondary | ICD-10-CM | POA: Diagnosis not present

## 2024-01-25 DIAGNOSIS — M5117 Intervertebral disc disorders with radiculopathy, lumbosacral region: Secondary | ICD-10-CM | POA: Diagnosis not present

## 2024-01-25 DIAGNOSIS — M955 Acquired deformity of pelvis: Secondary | ICD-10-CM | POA: Diagnosis not present

## 2024-01-25 DIAGNOSIS — M9905 Segmental and somatic dysfunction of pelvic region: Secondary | ICD-10-CM | POA: Diagnosis not present

## 2024-02-29 DIAGNOSIS — M5117 Intervertebral disc disorders with radiculopathy, lumbosacral region: Secondary | ICD-10-CM | POA: Diagnosis not present

## 2024-02-29 DIAGNOSIS — M9903 Segmental and somatic dysfunction of lumbar region: Secondary | ICD-10-CM | POA: Diagnosis not present

## 2024-02-29 DIAGNOSIS — M955 Acquired deformity of pelvis: Secondary | ICD-10-CM | POA: Diagnosis not present

## 2024-02-29 DIAGNOSIS — M9905 Segmental and somatic dysfunction of pelvic region: Secondary | ICD-10-CM | POA: Diagnosis not present

## 2024-03-20 DIAGNOSIS — Z0000 Encounter for general adult medical examination without abnormal findings: Secondary | ICD-10-CM | POA: Diagnosis not present

## 2024-03-20 DIAGNOSIS — G43001 Migraine without aura, not intractable, with status migrainosus: Secondary | ICD-10-CM | POA: Diagnosis not present

## 2024-03-20 DIAGNOSIS — M7989 Other specified soft tissue disorders: Secondary | ICD-10-CM | POA: Diagnosis not present

## 2024-03-20 DIAGNOSIS — I890 Lymphedema, not elsewhere classified: Secondary | ICD-10-CM | POA: Diagnosis not present

## 2024-03-20 DIAGNOSIS — Z1331 Encounter for screening for depression: Secondary | ICD-10-CM | POA: Diagnosis not present

## 2024-04-03 DIAGNOSIS — M955 Acquired deformity of pelvis: Secondary | ICD-10-CM | POA: Diagnosis not present

## 2024-04-03 DIAGNOSIS — M5117 Intervertebral disc disorders with radiculopathy, lumbosacral region: Secondary | ICD-10-CM | POA: Diagnosis not present

## 2024-04-03 DIAGNOSIS — M9905 Segmental and somatic dysfunction of pelvic region: Secondary | ICD-10-CM | POA: Diagnosis not present

## 2024-04-03 DIAGNOSIS — M9903 Segmental and somatic dysfunction of lumbar region: Secondary | ICD-10-CM | POA: Diagnosis not present

## 2024-05-08 DIAGNOSIS — M5117 Intervertebral disc disorders with radiculopathy, lumbosacral region: Secondary | ICD-10-CM | POA: Diagnosis not present

## 2024-05-08 DIAGNOSIS — M9903 Segmental and somatic dysfunction of lumbar region: Secondary | ICD-10-CM | POA: Diagnosis not present

## 2024-05-08 DIAGNOSIS — M955 Acquired deformity of pelvis: Secondary | ICD-10-CM | POA: Diagnosis not present

## 2024-05-08 DIAGNOSIS — M9905 Segmental and somatic dysfunction of pelvic region: Secondary | ICD-10-CM | POA: Diagnosis not present

## 2024-06-05 DIAGNOSIS — M9903 Segmental and somatic dysfunction of lumbar region: Secondary | ICD-10-CM | POA: Diagnosis not present

## 2024-06-05 DIAGNOSIS — M5117 Intervertebral disc disorders with radiculopathy, lumbosacral region: Secondary | ICD-10-CM | POA: Diagnosis not present

## 2024-06-05 DIAGNOSIS — M955 Acquired deformity of pelvis: Secondary | ICD-10-CM | POA: Diagnosis not present

## 2024-06-05 DIAGNOSIS — M9905 Segmental and somatic dysfunction of pelvic region: Secondary | ICD-10-CM | POA: Diagnosis not present

## 2024-07-12 DIAGNOSIS — M955 Acquired deformity of pelvis: Secondary | ICD-10-CM | POA: Diagnosis not present

## 2024-07-12 DIAGNOSIS — M9903 Segmental and somatic dysfunction of lumbar region: Secondary | ICD-10-CM | POA: Diagnosis not present

## 2024-07-12 DIAGNOSIS — M5117 Intervertebral disc disorders with radiculopathy, lumbosacral region: Secondary | ICD-10-CM | POA: Diagnosis not present

## 2024-07-12 DIAGNOSIS — M9905 Segmental and somatic dysfunction of pelvic region: Secondary | ICD-10-CM | POA: Diagnosis not present

## 2024-08-08 DIAGNOSIS — M9905 Segmental and somatic dysfunction of pelvic region: Secondary | ICD-10-CM | POA: Diagnosis not present

## 2024-08-08 DIAGNOSIS — M5117 Intervertebral disc disorders with radiculopathy, lumbosacral region: Secondary | ICD-10-CM | POA: Diagnosis not present

## 2024-08-08 DIAGNOSIS — M955 Acquired deformity of pelvis: Secondary | ICD-10-CM | POA: Diagnosis not present

## 2024-08-08 DIAGNOSIS — M9903 Segmental and somatic dysfunction of lumbar region: Secondary | ICD-10-CM | POA: Diagnosis not present

## 2024-09-05 DIAGNOSIS — M9903 Segmental and somatic dysfunction of lumbar region: Secondary | ICD-10-CM | POA: Diagnosis not present

## 2024-09-05 DIAGNOSIS — M9905 Segmental and somatic dysfunction of pelvic region: Secondary | ICD-10-CM | POA: Diagnosis not present

## 2024-09-05 DIAGNOSIS — M5117 Intervertebral disc disorders with radiculopathy, lumbosacral region: Secondary | ICD-10-CM | POA: Diagnosis not present

## 2024-09-05 DIAGNOSIS — M955 Acquired deformity of pelvis: Secondary | ICD-10-CM | POA: Diagnosis not present

## 2024-10-03 DIAGNOSIS — M9903 Segmental and somatic dysfunction of lumbar region: Secondary | ICD-10-CM | POA: Diagnosis not present

## 2024-10-03 DIAGNOSIS — M5117 Intervertebral disc disorders with radiculopathy, lumbosacral region: Secondary | ICD-10-CM | POA: Diagnosis not present

## 2024-10-03 DIAGNOSIS — M9905 Segmental and somatic dysfunction of pelvic region: Secondary | ICD-10-CM | POA: Diagnosis not present

## 2024-10-03 DIAGNOSIS — M955 Acquired deformity of pelvis: Secondary | ICD-10-CM | POA: Diagnosis not present

## 2024-10-31 DIAGNOSIS — M9905 Segmental and somatic dysfunction of pelvic region: Secondary | ICD-10-CM | POA: Diagnosis not present

## 2024-10-31 DIAGNOSIS — M9903 Segmental and somatic dysfunction of lumbar region: Secondary | ICD-10-CM | POA: Diagnosis not present

## 2024-10-31 DIAGNOSIS — M955 Acquired deformity of pelvis: Secondary | ICD-10-CM | POA: Diagnosis not present

## 2024-10-31 DIAGNOSIS — M5117 Intervertebral disc disorders with radiculopathy, lumbosacral region: Secondary | ICD-10-CM | POA: Diagnosis not present

## 2024-11-28 ENCOUNTER — Other Ambulatory Visit: Payer: Self-pay | Admitting: Family Medicine

## 2024-11-28 DIAGNOSIS — Z803 Family history of malignant neoplasm of breast: Secondary | ICD-10-CM

## 2024-11-28 DIAGNOSIS — Z1231 Encounter for screening mammogram for malignant neoplasm of breast: Secondary | ICD-10-CM

## 2025-01-21 ENCOUNTER — Encounter
